# Patient Record
Sex: Male | Born: 1965 | Race: White | Hispanic: No | Marital: Married | Smoking: Never smoker
Health system: Southern US, Community
[De-identification: ages and names within clinical notes are randomized; demographics above are authoritative.]

## PROBLEM LIST (undated history)

## (undated) DIAGNOSIS — Z87898 Personal history of other specified conditions: Secondary | ICD-10-CM

## (undated) DIAGNOSIS — E119 Type 2 diabetes mellitus without complications: Secondary | ICD-10-CM

## (undated) HISTORY — DX: Personal history of other specified conditions: Z87.898

---

## 2019-09-28 ENCOUNTER — Observation Stay (HOSPITAL_COMMUNITY)
Admission: EM | Admit: 2019-09-28 | Discharge: 2019-09-29 | Disposition: A | Discharge status: Home / Self Care | Payer: Managed Care, Other (non HMO) | Attending: Internal Medicine | Admitting: Internal Medicine

## 2019-09-28 ENCOUNTER — Emergency Department (HOSPITAL_COMMUNITY): Payer: Managed Care, Other (non HMO)

## 2019-09-28 ENCOUNTER — Encounter (HOSPITAL_COMMUNITY): Payer: Self-pay

## 2019-09-28 ENCOUNTER — Other Ambulatory Visit: Payer: Self-pay

## 2019-09-28 DIAGNOSIS — E119 Type 2 diabetes mellitus without complications: Secondary | ICD-10-CM | POA: Diagnosis not present

## 2019-09-28 DIAGNOSIS — I10 Essential (primary) hypertension: Secondary | ICD-10-CM | POA: Diagnosis not present

## 2019-09-28 DIAGNOSIS — E1165 Type 2 diabetes mellitus with hyperglycemia: Principal | ICD-10-CM | POA: Insufficient documentation

## 2019-09-28 LAB — CBC WITH DIFFERENTIAL/PLATELET
Abs Immature Granulocytes: 0.03 10*3/uL (ref 0.00–0.07)
Basophils Absolute: 0.1 10*3/uL (ref 0.0–0.1)
Basophils Relative: 1 %
Eosinophils Absolute: 0 10*3/uL (ref 0.0–0.5)
Eosinophils Relative: 1 %
HCT: 45.1 % (ref 39.0–52.0)
Hemoglobin: 15.9 g/dL (ref 13.0–17.0)
Immature Granulocytes: 0 %
Lymphocytes Relative: 27 %
Lymphs Abs: 1.9 10*3/uL (ref 0.7–4.0)
MCH: 30.8 pg (ref 26.0–34.0)
MCHC: 35.3 g/dL (ref 30.0–36.0)
MCV: 87.2 fL (ref 80.0–100.0)
Monocytes Absolute: 0.7 10*3/uL (ref 0.1–1.0)
Monocytes Relative: 9 %
Neutro Abs: 4.4 10*3/uL (ref 1.7–7.7)
Neutrophils Relative %: 62 %
Platelets: 243 10*3/uL (ref 150–400)
RBC: 5.17 MIL/uL (ref 4.22–5.81)
RDW: 11.9 % (ref 11.5–15.5)
WBC: 7.1 10*3/uL (ref 4.0–10.5)
nRBC: 0 % (ref 0.0–0.2)

## 2019-09-28 LAB — URINALYSIS, ROUTINE W REFLEX MICROSCOPIC
Bacteria, UA: NONE SEEN
Bilirubin Urine: NEGATIVE
Glucose, UA: >=500 mg/dL — AB
Hgb urine dipstick: NEGATIVE
Ketones, ur: NEGATIVE mg/dL
Leukocytes,Ua: NEGATIVE
Nitrite: NEGATIVE
Protein, ur: NEGATIVE mg/dL
Specific Gravity, Urine: 1.029 (ref 1.005–1.030)
pH: 5 (ref 5.0–8.0)

## 2019-09-28 LAB — COMPREHENSIVE METABOLIC PANEL
ALT: 27 U/L (ref 0–44)
AST: 19 U/L (ref 15–41)
Albumin: 3.6 g/dL (ref 3.5–5.0)
Alkaline Phosphatase: 106 U/L (ref 38–126)
Anion gap: 12 (ref 5–15)
BUN: 16 mg/dL (ref 6–20)
CO2: 23 mmol/L (ref 22–32)
Calcium: 8.5 mg/dL — ABNORMAL LOW (ref 8.9–10.3)
Chloride: 92 mmol/L — ABNORMAL LOW (ref 98–111)
Creatinine, Ser: 0.66 mg/dL (ref 0.61–1.24)
GFR calc Af Amer: >60 mL/min (ref >60)
GFR calc non Af Amer: >60 mL/min (ref >60)
Glucose, Bld: 526 mg/dL (ref 70–99)
Potassium: 3.6 mmol/L (ref 3.5–5.1)
Sodium: 127 mmol/L — ABNORMAL LOW (ref 135–145)
Total Bilirubin: 0.6 mg/dL (ref 0.3–1.2)
Total Protein: 6.9 g/dL (ref 6.5–8.1)

## 2019-09-28 LAB — LIPASE, BLOOD: Lipase: 65 U/L — ABNORMAL HIGH (ref 11–51)

## 2019-09-28 LAB — CBG MONITORING, ED: Glucose-Capillary: >600 mg/dL (ref 70–99)

## 2019-09-28 MED ORDER — SODIUM CHLORIDE 0.9 % IV BOLUS
1000.0000 mL | Freq: Once | INTRAVENOUS | Status: AC
  Administered 2019-09-28: 1000 mL via INTRAVENOUS

## 2019-09-28 MED ORDER — SODIUM CHLORIDE 0.9 % IV SOLN: INTRAVENOUS | Status: DC

## 2019-09-28 MED ORDER — HEPARIN SODIUM (PORCINE) 5000 UNIT/ML IJ SOLN
5000.0000 [IU] | Freq: Three times a day (TID) | INTRAMUSCULAR | Status: DC
  Administered 2019-09-29: 5000 [IU] via SUBCUTANEOUS
  Filled 2019-09-28 (×2): qty 1

## 2019-09-28 MED ORDER — HYDROCODONE-ACETAMINOPHEN 5-325 MG PO TABS: 1.0000 | ORAL_TABLET | ORAL | Status: DC | PRN

## 2019-09-28 MED ORDER — HYDRALAZINE HCL 20 MG/ML IJ SOLN: 10.0000 mg | Freq: Four times a day (QID) | INTRAMUSCULAR | Status: DC | PRN

## 2019-09-28 MED ORDER — POLYETHYLENE GLYCOL 3350 17 G PO PACK: 17.0000 g | PACK | Freq: Every day | ORAL | Status: DC | PRN

## 2019-09-28 MED ORDER — INSULIN ASPART 100 UNIT/ML ~~LOC~~ SOLN: 0.0000 [IU] | SUBCUTANEOUS | Status: DC

## 2019-09-28 MED ORDER — POTASSIUM CHLORIDE 20 MEQ PO PACK
20.0000 meq | PACK | Freq: Once | ORAL | Status: AC
  Administered 2019-09-28: 20 meq via ORAL
  Filled 2019-09-28: qty 1

## 2019-09-28 MED ORDER — ONDANSETRON HCL 4 MG PO TABS: 4.0000 mg | ORAL_TABLET | Freq: Four times a day (QID) | ORAL | Status: DC | PRN

## 2019-09-28 MED ORDER — INSULIN ASPART 100 UNIT/ML ~~LOC~~ SOLN
10.0000 [IU] | Freq: Once | SUBCUTANEOUS | Status: AC
  Administered 2019-09-28: 10 [IU] via INTRAVENOUS
  Filled 2019-09-28: qty 1

## 2019-09-28 MED ORDER — ACETAMINOPHEN 650 MG RE SUPP: 650.0000 mg | Freq: Four times a day (QID) | RECTAL | Status: DC | PRN

## 2019-09-28 MED ORDER — ACETAMINOPHEN 325 MG PO TABS: 650.0000 mg | ORAL_TABLET | Freq: Four times a day (QID) | ORAL | Status: DC | PRN

## 2019-09-28 MED ORDER — ONDANSETRON HCL 4 MG/2ML IJ SOLN: 4.0000 mg | Freq: Four times a day (QID) | INTRAMUSCULAR | Status: DC | PRN

## 2019-09-28 NOTE — ED Triage Notes (Signed)
Pt reports being thirsty for months, family decided to check blood sugar and it read high. Pt blood glucose in triage is high. No other symptoms reported.

## 2019-09-28 NOTE — H&P (Signed)
TRH H&P    Patient Demographics:    Travis [name], is a 54 y.o. male  MRN: [id number]  DOB - [birth year]  Admit Date - 09/28/2019  Referring MD/NP/PA: Deretha Emory  Outpatient Primary MD for the patient is Pcp, No  Patient coming from: Home  Chief complaint- Hyperglycemia   HPI:    Travis [name]  is a 54 y.o. male, with no known medical history, who does not follow with a doctor presents to the ED with a chief complaint of hyperglycemia. Patient reports that he has been losing weight, and has had polydipsia, so a nurse relative of his checked his glucose and it was greater than 600. Patient does not have a PCP so he came into the ER. Patient reports that his weight loss has been over 10 months and has been about 35 pounds. He reports that his polydipsia started sometime last year. He has associated polyuria. He does not have polyphagia. He has no family history of diabetes that he knows of. He does report some paresthesias in his lower extremities more on the left than the right, that could be consistent with neuropathy. He has had no changes in vision. He has had no intolerance to hot or cold. No other endocrine or other complaints at this time.  In the ED Temperature 98.5, heart rate 92, respiratory rate 20, blood pressure 116/99 White blood cell count 7.1, hyponatremia 127 that corrects to 134, potassium of 3.6, glucose of 526 on the BMP. His initial CBG was greater than 600. Patient's gap is 12. Lipase is 65, does not complain of abdominal pain. UA shows glucose urea greater than 500. Chest x-ray shows no active intrathoracic process. Admission requested without first giving insulin, as a caution to treat potassium first.    Review of systems:    In addition to the HPI above,  No Fever-chills, No Headache, No changes with Vision or hearing, No problems swallowing food or Liquids, No Chest pain, Cough or  Shortness of Breath, No Abdominal pain, No Nausea or Vomiting, bowel movements are regular, No Blood in stool or Urine, No dysuria, No new skin rashes or bruises, No new joints pains-aches,  No new weakness, tingling, numbness in any extremity, No recent weight gain or loss, Positive for polyuria, polydypsia but no polyphagia, No significant Mental Stressors.  All other systems reviewed and are negative.    Past History of the following :    History reviewed. No pertinent past medical history.    History reviewed. No pertinent surgical history.    Social History:      Social History   Tobacco Use  . Smoking status: Never Smoker  . Smokeless tobacco: Never Used  Substance Use Topics  . Alcohol use: Yes    Alcohol/week: 1.0 standard drink    Types: 1 Glasses of wine per week    Comment: occ       Family History :    No family history on file. Patient reports no family history that he knows of.   Home Medications:  Prior to Admission medications   Not on File     Allergies:    No Known Allergies   Physical Exam:   Vitals  Blood pressure (!) 116/99, pulse 92, temperature 98.5 F (36.9 C), temperature source Oral, resp. rate 20, height 5\' 7"  (1.702 m), weight 59 kg, SpO2 100 %.  1.  General: Sitting up crosslegged in bed no acute distress  2. Psychiatric: Mood and behavior normal for situation Little insight to healthcare  3. Neurologic: Cranial nerves II through XII are grossly intact, no focal deficits on limited exam, at baseline  4. HEENMT:  Head is atraumatic, normocephalic, pupils are reactive to light, neck is supple, trachea is midline, mucous membranes are moist  5. Respiratory : Lungs are clear to auscultation bilaterally Barrel-shaped chest without history of smoking 6. Cardiovascular : Heart rate and rhythm are regular, no murmurs rubs or gallops  7. Gastrointestinal:  No abdominal tenderness, no distention, soft  8. Skin:    No acute lesions on limited skin exam  9.Musculoskeletal:  No peripheral edema or acute deformity    Data Review:    CBC Recent Labs  Lab 09/28/19 2055  WBC 7.1  HGB 15.9  HCT 45.1  PLT 243  MCV 87.2  MCH 30.8  MCHC 35.3  RDW 11.9  LYMPHSABS 1.9  MONOABS 0.7  EOSABS 0.0  BASOSABS 0.1   ------------------------------------------------------------------------------------------------------------------  Results for orders placed or performed during the hospital encounter of 09/28/19 (from the past 48 hour(s))  CBG monitoring, ED     Status: Abnormal   Collection Time: 09/28/19  8:18 PM  Result Value Ref Range   Glucose-Capillary >600 (HH) 70 - 99 mg/dL    Comment: Glucose reference range applies only to samples taken after fasting for at least 8 hours.  CBC with Differential/Platelet     Status: None   Collection Time: 09/28/19  8:55 PM  Result Value Ref Range   WBC 7.1 4.0 - 10.5 K/uL   RBC 5.17 4.22 - 5.81 MIL/uL   Hemoglobin 15.9 13.0 - 17.0 g/dL   HCT 09/30/19 39 - 52 %   MCV 87.2 80.0 - 100.0 fL   MCH 30.8 26.0 - 34.0 pg   MCHC 35.3 30.0 - 36.0 g/dL   RDW 46.2 70.3 - 50.0 %   Platelets 243 150 - 400 K/uL   nRBC 0.0 0.0 - 0.2 %   Neutrophils Relative % 62 %   Neutro Abs 4.4 1.7 - 7.7 K/uL   Lymphocytes Relative 27 %   Lymphs Abs 1.9 0.7 - 4.0 K/uL   Monocytes Relative 9 %   Monocytes Absolute 0.7 0 - 1 K/uL   Eosinophils Relative 1 %   Eosinophils Absolute 0.0 0 - 0 K/uL   Basophils Relative 1 %   Basophils Absolute 0.1 0 - 0 K/uL   Immature Granulocytes 0 %   Abs Immature Granulocytes 0.03 0.00 - 0.07 K/uL    Comment: Performed at [facility], [address] [place], [place]  Urinalysis, Routine w reflex microscopic Urine, Clean Catch     Status: Abnormal   Collection Time: 09/28/19  8:55 PM  Result Value Ref Range   Color, Urine COLORLESS (A) YELLOW   APPearance CLEAR CLEAR   Specific Gravity, Urine 1.029 1.005 - 1.030   pH 5.0 5.0 - 8.0    Glucose, UA >=500 (A) NEGATIVE mg/dL   Hgb urine dipstick NEGATIVE NEGATIVE   Bilirubin Urine NEGATIVE NEGATIVE   Ketones, ur  NEGATIVE NEGATIVE mg/dL   Protein, ur NEGATIVE NEGATIVE mg/dL   Nitrite NEGATIVE NEGATIVE   Leukocytes,Ua NEGATIVE NEGATIVE   WBC, UA 0-5 0 - 5 WBC/hpf   Bacteria, UA NONE SEEN NONE SEEN    Comment: Performed at [facility], [address] [place], [place] [address]  Comprehensive metabolic panel     Status: Abnormal   Collection Time: 09/28/19  9:35 PM  Result Value Ref Range   Sodium 127 (L) 135 - 145 mmol/L   Potassium 3.6 3.5 - 5.1 mmol/L   Chloride 92 (L) 98 - 111 mmol/L   CO2 23 22 - 32 mmol/L   Glucose, Bld 526 (HH) 70 - 99 mg/dL    Comment: Glucose reference range applies only to samples taken after fasting for at least 8 hours. CRITICAL RESULT CALLED TO, READ BACK BY AND VERIFIED WITH: T DEVOLT AT 2226 ON 09/28/2019 BY MOSLEY,J     BUN 16 6 - 20 mg/dL   Creatinine, Ser 6.040.66 0.61 - 1.24 mg/dL   Calcium 8.5 (L) 8.9 - 10.3 mg/dL   Total Protein 6.9 6.5 - 8.1 g/dL   Albumin 3.6 3.5 - 5.0 g/dL   AST 19 15 - 41 U/L   ALT 27 0 - 44 U/L   Alkaline Phosphatase 106 38 - 126 U/L   Total Bilirubin 0.6 0.3 - 1.2 mg/dL   GFR calc non Af Amer >60 >60 mL/min   GFR calc Af Amer >60 >60 mL/min   Anion gap 12 5 - 15    Comment: Performed at [facility], [address] [place], [place] [address]  Lipase, blood     Status: Abnormal   Collection Time: 09/28/19  9:35 PM  Result Value Ref Range   Lipase 65 (H) 11 - 51 U/L    Comment: Performed at [facility], [address] [place], [place] [address]    Chemistries  Recent Labs  Lab 09/28/19 2135  NA 127*  K 3.6  CL 92*  CO2 23  GLUCOSE 526*  BUN 16  CREATININE 0.66  CALCIUM 8.5*  AST 19  ALT 27  ALKPHOS 106  BILITOT 0.6    ------------------------------------------------------------------------------------------------------------------  ------------------------------------------------------------------------------------------------------------------ GFR: Estimated Creatinine Clearance: 86 mL/min (by C-G formula based on SCr of 0.66 mg/dL). Liver Function Tests: Recent Labs  Lab 09/28/19 2135  AST 19  ALT 27  ALKPHOS 106  BILITOT 0.6  PROT 6.9  ALBUMIN 3.6   Recent Labs  Lab 09/28/19 2135  LIPASE 65*   No results for input(s): AMMONIA in the last 168 hours. Coagulation Profile: No results for input(s): INR, PROTIME in the last 168 hours. Cardiac Enzymes: No results for input(s): CKTOTAL, CKMB, CKMBINDEX, TROPONINI in the last 168 hours. BNP (last 3 results) No results for input(s): PROBNP in the last 8760 hours. HbA1C: No results for input(s): HGBA1C in the last 72 hours. CBG: Recent Labs  Lab 09/28/19 2018  GLUCAP >600*   Lipid Profile: No results for input(s): CHOL, HDL, LDLCALC, TRIG, CHOLHDL, LDLDIRECT in the last 72 hours. Thyroid Function Tests: No results for input(s): TSH, T4TOTAL, FREET4, T3FREE, THYROIDAB in the last 72 hours. Anemia Panel: No results for input(s): VITAMINB12, FOLATE, FERRITIN, TIBC, IRON, RETICCTPCT in the last 72 hours.  --------------------------------------------------------------------------------------------------------------- Urine analysis:    Component Value Date/Time   COLORURINE COLORLESS (A) 09/28/2019 2055   APPEARANCEUR CLEAR 09/28/2019 2055   LABSPEC 1.029 09/28/2019 2055   PHURINE 5.0 09/28/2019 2055   GLUCOSEU >=500 (A) 09/28/2019 2055  HGBUR NEGATIVE 09/28/2019 2055   BILIRUBINUR NEGATIVE 09/28/2019 2055   KETONESUR NEGATIVE 09/28/2019 2055   PROTEINUR NEGATIVE 09/28/2019 2055   NITRITE NEGATIVE 09/28/2019 2055   LEUKOCYTESUR NEGATIVE 09/28/2019 2055      Imaging Results:    DG Chest Port 1 View  Result Date:  09/28/2019 CLINICAL DATA:  Hyperglycemia EXAM: PORTABLE CHEST 1 VIEW COMPARISON:  None. FINDINGS: Single frontal view of the chest demonstrates an unremarkable cardiac silhouette. Small hiatal hernia. No airspace disease, effusion, or pneumothorax. IMPRESSION: 1. No acute intrathoracic process. Electronically Signed   By: Sharlet Salina M.D.   On: 09/28/2019 21:23      Assessment & Plan:    Active Problems:   New onset type 2 diabetes mellitus (HCC)   1. New onset DMII 1. Glucose > 600 on arrival.  2. K+ 3.6 - given in ED 3. Gap 12, glucose 526, Bacrb 23 4. 10 units novolog corrected to glucose to 216 5. q4H CBG and SSI 6. Hgb A1C pending 7. Heart Healthy / Carb modified diet 8. Diabetes coordinator and dietitian consulted if available on weekend 2. Dehydration 1. Tachy At 92, with soft with SBP 116 2. 1 L NaCl in ED 3. Likely 2/2 polyuria due to hyperglycemia 4. Continue IV fluids 3. Elevated lipase 1. Lipase 65 2. No abdominal tenderness    DVT Prophylaxis-  Heparin - SCDs   AM Labs Ordered, also please review Full Orders  Family Communication: Admission, patients condition and plan of care including tests being ordered have been discussed with the patient and wife Janelle Floor who indicate understanding and agree with the plan and Code Status.  Code Status:  Full  Admission status: Observation for further management of glucose, electrolytes and DM education. Patient does not follow with PCP, and electrolytes are borderline. Discharge without stabilization of glucose and electrolytes would put this patient at risk of decompensating into an acidotic state. Also, patient is tachycardic and pressure is soft, indicating dehydration as well.      Time spent in minutes : 64   Cletus Mehlhoff B Zierle-Ghosh DO

## 2019-09-28 NOTE — ED Notes (Signed)
Entered room and introduced self to patient and explained role in plan of care. Pt appears in no acute distress, respirations are even and unlabored with equal chest rise and fall. Bed is locked in the lowest position, side rails x2, call bell within reach. All questions and concerns voiced addressed by this RN. Will continue to monitor at this time.

## 2019-09-28 NOTE — ED Notes (Signed)
Pt up and ambulatory to restroom. Urine sample provided by patient at this time.

## 2019-09-28 NOTE — ED Notes (Signed)
X Ray at bedside at this time.  

## 2019-09-28 NOTE — ED Provider Notes (Signed)
[facility] EMERGENCY DEPARTMENT Provider Note   CSN: [id number] Arrival date & time: 09/28/19  1943     History Chief Complaint  Patient presents with  . Hyperglycemia    Travis [name] is a 54 y.o. male.  Patient with negative past medical history.  Patient states that for about a month he has had increased thirst and has been increased urination.  Family members checked his blood sugar.  Has a family member that is a Engineer, civil (consulting).  And it was high.  Blood sugar here was greater than 600.  Patient without any symptoms.  Also hypertensive here with a blood pressure 177/98.  Patient without any chest pain without any shortness of breath no fevers no cough no congestion no headache.  Patient has not had any of the Covid vaccines        History reviewed. No pertinent past medical history.  Patient Active Problem List   Diagnosis Date Noted  . New onset type 2 diabetes mellitus (HCC) 09/28/2019    History reviewed. No pertinent surgical history.     No family history on file.  Social History   Tobacco Use  . Smoking status: Never Smoker  . Smokeless tobacco: Never Used  Substance Use Topics  . Alcohol use: Yes    Alcohol/week: 1.0 standard drink    Types: 1 Glasses of wine per week    Comment: occ  . Drug use: Never    Home Medications Prior to Admission medications   Not on File    Allergies    Patient has no known allergies.  Review of Systems   Review of Systems  Constitutional: Negative for chills and fever.  HENT: Negative for congestion, rhinorrhea and sore throat.   Eyes: Negative for visual disturbance.  Respiratory: Negative for cough and shortness of breath.   Cardiovascular: Negative for chest pain and leg swelling.  Gastrointestinal: Negative for abdominal pain, diarrhea, nausea and vomiting.  Genitourinary: Negative for dysuria.  Musculoskeletal: Negative for back pain and neck pain.  Skin: Negative for rash.  Neurological: Negative for  dizziness, light-headedness and headaches.  Hematological: Does not bruise/bleed easily.  Psychiatric/Behavioral: Negative for confusion.    Physical Exam Updated Vital Signs BP (!) 116/99   Pulse 92   Temp 98.5 F (36.9 C) (Oral)   Resp 20   Ht 1.702 m (5\' 7" )   Wt 59 kg   SpO2 100%   BMI 20.36 kg/m   Physical Exam Vitals and nursing note reviewed.  Constitutional:      Appearance: Normal appearance. He is well-developed.  HENT:     Head: Normocephalic and atraumatic.  Eyes:     Extraocular Movements: Extraocular movements intact.     Conjunctiva/sclera: Conjunctivae normal.     Pupils: Pupils are equal, round, and reactive to light.  Cardiovascular:     Rate and Rhythm: Normal rate and regular rhythm.     Heart sounds: No murmur heard.   Pulmonary:     Effort: Pulmonary effort is normal. No respiratory distress.     Breath sounds: Normal breath sounds.  Abdominal:     Palpations: Abdomen is soft.     Tenderness: There is no abdominal tenderness.  Musculoskeletal:        General: No swelling. Normal range of motion.     Cervical back: Normal range of motion and neck supple.  Skin:    General: Skin is warm and dry.     Capillary Refill: Capillary refill takes less than  2 seconds.  Neurological:     General: No focal deficit present.     Mental Status: He is alert and oriented to person, place, and time.     Cranial Nerves: No cranial nerve deficit.     Sensory: No sensory deficit.     Motor: No weakness.     ED Results / Procedures / Treatments   Labs (all labs ordered are listed, but only abnormal results are displayed) Labs Reviewed  COMPREHENSIVE METABOLIC PANEL - Abnormal; Notable for the following components:      Result Value   Sodium 127 (*)    Chloride 92 (*)    Glucose, Bld 526 (*)    Calcium 8.5 (*)    All other components within normal limits  LIPASE, BLOOD - Abnormal; Notable for the following components:   Lipase 65 (*)    All other  components within normal limits  URINALYSIS, ROUTINE W REFLEX MICROSCOPIC - Abnormal; Notable for the following components:   Color, Urine COLORLESS (*)    Glucose, UA >=500 (*)    All other components within normal limits  CBG MONITORING, ED - Abnormal; Notable for the following components:   Glucose-Capillary >600 (*)    All other components within normal limits  CBC WITH DIFFERENTIAL/PLATELET    EKG None  Radiology DG Chest Port 1 View  Result Date: 09/28/2019 CLINICAL DATA:  Hyperglycemia EXAM: PORTABLE CHEST 1 VIEW COMPARISON:  None. FINDINGS: Single frontal view of the chest demonstrates an unremarkable cardiac silhouette. Small hiatal hernia. No airspace disease, effusion, or pneumothorax. IMPRESSION: 1. No acute intrathoracic process. Electronically Signed   By: Sharlet Salina M.D.   On: 09/28/2019 21:23    Procedures Procedures (including critical care time)  Medications Ordered in ED Medications  0.9 %  sodium chloride infusion ( Intravenous New Bag/Given 09/28/19 2123)  insulin aspart (novoLOG) injection 10 Units (has no administration in time range)  potassium chloride (KLOR-CON) packet 20 mEq (has no administration in time range)  sodium chloride 0.9 % bolus 1,000 mL (1,000 mLs Intravenous Bolus from Bag 09/28/19 2120)    ED Course  I have reviewed the triage vital signs and the nursing notes.  Pertinent labs & imaging results that were available during my care of the patient were reviewed by me and considered in my medical decision making (see chart for details).    MDM Rules/Calculators/A&P                          Patient with new onset diabetes.  Fingerstick blood sugar here was greater than 600.  Complete metabolic panel had blood sugar of 526.  But this was drawn somewhat after fluids were given.  Patient given a liter of fluids.  Not acidotic CO2 was 23.  Sodium is low at 127 due to pseudohyponatremia.  Liver function test without any significant abnormality  Covid testing is pending.  Chest x-ray was negative.  Patient nontoxic no acute distress.  Needs admission for new onset diabetes.  Did order once we had electrolytes back his potassium was 3.6.  Did order 10 units of regular insulin IV did not feel that patient needs insulin drip at this time.  Probably had the elevated blood sugar for a period of time.  Also probably needs some blood pressure control.  Discussed with hospitalist they will see and admit.     Final Clinical Impression(s) / ED Diagnoses Final diagnoses:  Diabetes mellitus, new onset (  [facility])  Essential hypertension    Rx / DC Orders ED Discharge Orders    None       Vanetta Mulders, MD 09/28/19 2302

## 2019-09-29 DIAGNOSIS — E119 Type 2 diabetes mellitus without complications: Secondary | ICD-10-CM | POA: Diagnosis not present

## 2019-09-29 LAB — BASIC METABOLIC PANEL
Anion gap: 11 (ref 5–15)
Anion gap: 8 (ref 5–15)
Anion gap: 8 (ref 5–15)
BUN: 10 mg/dL (ref 6–20)
BUN: 13 mg/dL (ref 6–20)
BUN: 8 mg/dL (ref 6–20)
CO2: 26 mmol/L (ref 22–32)
CO2: 27 mmol/L (ref 22–32)
CO2: 29 mmol/L (ref 22–32)
Calcium: 8.3 mg/dL — ABNORMAL LOW (ref 8.9–10.3)
Calcium: 8.9 mg/dL (ref 8.9–10.3)
Calcium: 9.2 mg/dL (ref 8.9–10.3)
Chloride: 95 mmol/L — ABNORMAL LOW (ref 98–111)
Chloride: 98 mmol/L (ref 98–111)
Chloride: 99 mmol/L (ref 98–111)
Creatinine, Ser: 0.47 mg/dL — ABNORMAL LOW (ref 0.61–1.24)
Creatinine, Ser: 0.56 mg/dL — ABNORMAL LOW (ref 0.61–1.24)
Creatinine, Ser: 0.58 mg/dL — ABNORMAL LOW (ref 0.61–1.24)
GFR calc Af Amer: >60 mL/min (ref >60)
GFR calc Af Amer: >60 mL/min (ref >60)
GFR calc Af Amer: >60 mL/min (ref >60)
GFR calc non Af Amer: >60 mL/min (ref >60)
GFR calc non Af Amer: >60 mL/min (ref >60)
GFR calc non Af Amer: >60 mL/min (ref >60)
Glucose, Bld: 213 mg/dL — ABNORMAL HIGH (ref 70–99)
Glucose, Bld: 255 mg/dL — ABNORMAL HIGH (ref 70–99)
Glucose, Bld: 338 mg/dL — ABNORMAL HIGH (ref 70–99)
Potassium: 3.1 mmol/L — ABNORMAL LOW (ref 3.5–5.1)
Potassium: 3.4 mmol/L — ABNORMAL LOW (ref 3.5–5.1)
Potassium: 3.5 mmol/L (ref 3.5–5.1)
Sodium: 132 mmol/L — ABNORMAL LOW (ref 135–145)
Sodium: 134 mmol/L — ABNORMAL LOW (ref 135–145)
Sodium: 135 mmol/L (ref 135–145)

## 2019-09-29 LAB — CBC
HCT: 41 % (ref 39.0–52.0)
Hemoglobin: 14.1 g/dL (ref 13.0–17.0)
MCH: 30.5 pg (ref 26.0–34.0)
MCHC: 34.4 g/dL (ref 30.0–36.0)
MCV: 88.7 fL (ref 80.0–100.0)
Platelets: 232 10*3/uL (ref 150–400)
RBC: 4.62 MIL/uL (ref 4.22–5.81)
RDW: 12.1 % (ref 11.5–15.5)
WBC: 7.2 10*3/uL (ref 4.0–10.5)
nRBC: 0 % (ref 0.0–0.2)

## 2019-09-29 LAB — MAGNESIUM: Magnesium: 1.7 mg/dL (ref 1.7–2.4)

## 2019-09-29 LAB — LIPID PANEL
Cholesterol: 111 mg/dL (ref 0–200)
HDL: 31 mg/dL — ABNORMAL LOW (ref >40)
LDL Cholesterol: 55 mg/dL (ref 0–99)
Total CHOL/HDL Ratio: 3.6 RATIO
Triglycerides: 123 mg/dL (ref <150)
VLDL: 25 mg/dL (ref 0–40)

## 2019-09-29 LAB — TSH: TSH: 1.2 u[IU]/mL (ref 0.350–4.500)

## 2019-09-29 LAB — CBG MONITORING, ED
Glucose-Capillary: 206 mg/dL — ABNORMAL HIGH (ref 70–99)
Glucose-Capillary: 216 mg/dL — ABNORMAL HIGH (ref 70–99)
Glucose-Capillary: 233 mg/dL — ABNORMAL HIGH (ref 70–99)

## 2019-09-29 LAB — HIV ANTIBODY (ROUTINE TESTING W REFLEX): HIV Screen 4th Generation wRfx: NONREACTIVE

## 2019-09-29 MED ORDER — INSULIN STARTER KIT- PEN NEEDLES (ENGLISH)
1.0000 | Freq: Once | Status: AC
  Administered 2019-09-29: 1
  Filled 2019-09-29: qty 1

## 2019-09-29 MED ORDER — INSULIN ASPART 100 UNIT/ML ~~LOC~~ SOLN
0.0000 [IU] | Freq: Three times a day (TID) | SUBCUTANEOUS | Status: DC
  Filled 2019-09-29 (×2): qty 1

## 2019-09-29 MED ORDER — INSULIN ASPART 100 UNIT/ML ~~LOC~~ SOLN
0.0000 [IU] | Freq: Every day | SUBCUTANEOUS | Status: DC
  Administered 2019-09-29: 2 [IU] via SUBCUTANEOUS

## 2019-09-29 MED ORDER — INSULIN STARTER KIT- PEN NEEDLES (ENGLISH)
1.0000 | Freq: Once | Status: DC
  Filled 2019-09-29: qty 1

## 2019-09-29 MED ORDER — LIVING WELL WITH DIABETES BOOK
Freq: Once | Status: AC
  Filled 2019-09-29: qty 1

## 2019-09-29 MED ORDER — INSULIN GLARGINE 100 UNIT/ML ~~LOC~~ SOLN: 14.0000 [IU] | Freq: Every day | SUBCUTANEOUS | 11 refills | Status: DC

## 2019-09-29 MED ORDER — BLOOD GLUCOSE MONITOR KIT
PACK | 0 refills | Status: DC
Start: 2019-09-29 — End: 2019-09-29

## 2019-09-29 MED ORDER — LIVING WELL WITH DIABETES BOOK
Freq: Once | Status: DC
  Filled 2019-09-29: qty 1

## 2019-09-29 MED ORDER — INSULIN STARTER KIT- PEN NEEDLES (ENGLISH): 1.0000 | Freq: Once | 0 refills | Status: DC

## 2019-09-29 MED ORDER — BLOOD GLUCOSE MONITOR KIT
PACK | 0 refills | Status: DC
Start: 2019-09-29 — End: 2021-04-21

## 2019-09-29 MED ORDER — INSULIN STARTER KIT- PEN NEEDLES (ENGLISH): 1.0000 | Freq: Once | 0 refills | Status: AC

## 2019-09-29 MED ORDER — LIVING WELL WITH DIABETES BOOK: 1.0000 | Freq: Once | 0 refills | Status: AC

## 2019-09-29 MED ORDER — INSULIN GLARGINE 100 UNIT/ML ~~LOC~~ SOLN
14.0000 [IU] | Freq: Every day | SUBCUTANEOUS | Status: DC
  Administered 2019-09-29: 14 [IU] via SUBCUTANEOUS
  Filled 2019-09-29 (×2): qty 0.14

## 2019-09-29 MED ORDER — INSULIN ASPART 100 UNIT/ML ~~LOC~~ SOLN
0.0000 [IU] | SUBCUTANEOUS | Status: DC
  Administered 2019-09-29 (×2): 2 [IU] via SUBCUTANEOUS
  Filled 2019-09-29 (×2): qty 1

## 2019-09-29 NOTE — ED Notes (Signed)
Discussed how to check his blood sugar twice a day , once at breakfast and once at dinner , and to take 14 units of Lantus a day with flexpen per Dr,

## 2019-09-29 NOTE — Discharge Summary (Signed)
Physician Discharge Summary  Patient ID: Travis [name] MRN: [id number] DOB/AGE: [birth year] 54 y.o.  Admit date: 09/28/2019 Discharge date: 09/29/2019  Admission Diagnoses: New onset diabetes, dehydration, elevated lipase  Discharge Diagnoses:  Active Problems:   New onset type 2 diabetes mellitus ([place])   Discharged Condition: fair  Hospital Course: 54 year old male with no known medical history prior to his admission presented to the ED with a chief complaint of hyperglycemia.  In the ED his glucose was found to be over 600 on the initial CBG.  On his BMP his glucose was 526, his gap is within normal limits, his bicarb is within normal limits.  Patient's potassium at the time was 3.6, patient was given p.o. potassium to account for shift when insulin would be administered.  Patient was given 10 units of NovoLog which corrected his glucose to 216.  CBGs are closely monitored every 2 hours.  Sliding scale was administered every 4 hours.  Potassium was trended the next day on BMP and was 3.5.   Patient was started on Lantus 14 units daily which is consistent with his insulin needs over the past 24 hours.  Patient to be started on Metformin at discharge.  Patient educated on potential complications associated with diabetes mellitus, appropriate diet for glycemic control, and the importance of glycemic control. He has been educated on administration of insulin an monitoring her glucose. I have discussed with the patient dietary guideline. Patient discharged in stable condition with recommendation to follow-up with his PCP within 2 weeks.  Patient provided with a list of PCPs in the area.  Consults: dietician and diabetes coordinator  The patient will be set up with an outpatient appointment to receive diabetic education.  Significant Diagnostic Studies: labs: Glucose > 600, Hgb A1C unknown.  Treatments: IV hydration and glycemic control with insulin.  Discharge Exam: Blood pressure (!) 143/94,  pulse 69, temperature 98.5 F (36.9 C), temperature source Oral, resp. rate 15, height _0  (1.702 m), weight 59 kg, SpO2 97 %. Exam:  Constitutional:  . The patient is awake, alert, and oriented x 3. No acute distress. He is feeling better. Respiratory:  . No increased work of breathing. . No wheezes, rales, or rhonchi . No tactile fremitus Cardiovascular:  . Regular rate and rhythm . No murmurs, ectopy, or gallups. . No lateral PMI. No thrills. Abdomen:  . Abdomen is soft, non-tender, non-distended . No hernias, masses, or organomegaly . Normoactive bowel sounds.  Musculoskeletal:  . No cyanosis, clubbing, or edema Skin:  . No rashes, lesions, ulcers . palpation of skin: no induration or nodules Neurologic:  . CN 2-12 intact . Sensation all 4 extremities intact Psychiatric:  . Mental status o Mood, affect appropriate o Orientation to person, place, time  . judgment and insight appear intact  Disposition: Patient discharged home in stable condition with recommendation to follow-up with PCP in 7-10 days. He is to keep appointment with diabetic educator. He will be contacted by phone to set up appointment.    Allergies as of 09/29/2019   No Known Allergies     Medication List    TAKE these medications   blood glucose meter kit and supplies Kit Check glucose once in the morning before breakfast and once in the evening before dinner. Write down glucoses and take in to visit with PCP. Dispense based on patient and insurance preference. Use up to four times daily as directed. (FOR ICD-9 250.00, 250.01).   insulin glargine 100 UNIT/ML injection Commonly known  as: LANTUS Inject 0.14 mLs (14 Units total) into the skin daily. Start taking on: September 30, 2019   insulin starter kit- pen needles Misc 1 kit by Other route once for 1 dose.   living well with diabetes book Misc 1 each by Does not apply route once for 1 dose.            Durable Medical Equipment   (From admission, onward)         Start     Ordered   09/29/19 0904  DME Glucometer  Once        09/29/19 0905         38 minutes in the evaluation and discharge of this patient.  Signed: William Schake, DO 09/29/2019, 2:42 PM

## 2019-09-30 LAB — HEMOGLOBIN A1C
Hgb A1c MFr Bld: >15.5 % — ABNORMAL HIGH (ref 4.8–5.6)
Hgb A1c MFr Bld: >15.5 % — ABNORMAL HIGH (ref 4.8–5.6)
Mean Plasma Glucose: >398 mg/dL
Mean Plasma Glucose: >398 mg/dL

## 2019-09-30 NOTE — TOC Progression Note (Signed)
Transition of Care [facility]) - Progression Note    Patient Details  Name: Travis [name] MRN: [id number] Date of Birth: [birth year]  [facility] CM/SW Contact  Karn [place], [place] Phone Number: 09/30/2019, 2:36 PM  Clinical Narrative:  Pt d/c yesterday. Per MD, needs diabetic education. LCSW discussed with diabetic coordinator, Zerita Boers who will follow up.          Expected Discharge Plan and Services           Expected Discharge Date: 09/29/19                                     Social Determinants of Health (SDOH) Interventions    Readmission Risk Interventions No flowsheet data found.

## 2020-02-10 ENCOUNTER — Other Ambulatory Visit (HOSPITAL_COMMUNITY): Payer: Self-pay | Admitting: Internal Medicine

## 2020-02-10 DIAGNOSIS — Z13820 Encounter for screening for osteoporosis: Secondary | ICD-10-CM

## 2020-02-20 ENCOUNTER — Inpatient Hospital Stay (HOSPITAL_COMMUNITY): Admission: RE | Admit: 2020-02-20 | Payer: Managed Care, Other (non HMO) | Source: Ambulatory Visit

## 2020-03-09 ENCOUNTER — Other Ambulatory Visit: Payer: Self-pay

## 2020-03-09 ENCOUNTER — Ambulatory Visit (HOSPITAL_COMMUNITY)
Admission: RE | Admit: 2020-03-09 | Discharge: 2020-03-09 | Disposition: A | Discharge status: Home / Self Care | Payer: Managed Care, Other (non HMO) | Source: Ambulatory Visit | Attending: Internal Medicine | Admitting: Internal Medicine

## 2020-03-09 DIAGNOSIS — Z13820 Encounter for screening for osteoporosis: Secondary | ICD-10-CM | POA: Diagnosis present

## 2020-04-14 ENCOUNTER — Ambulatory Visit
Admission: EM | Admit: 2020-04-14 | Discharge: 2020-04-14 | Disposition: A | Discharge status: Home / Self Care | Payer: Managed Care, Other (non HMO) | Attending: Internal Medicine | Admitting: Internal Medicine

## 2020-04-14 ENCOUNTER — Encounter: Payer: Self-pay | Admitting: Emergency Medicine

## 2020-04-14 ENCOUNTER — Emergency Department (HOSPITAL_COMMUNITY)
Admission: EM | Admit: 2020-04-14 | Discharge: 2020-04-15 | Disposition: A | Discharge status: Home / Self Care | Payer: Managed Care, Other (non HMO) | Attending: Emergency Medicine | Admitting: Emergency Medicine

## 2020-04-14 ENCOUNTER — Other Ambulatory Visit: Payer: Self-pay

## 2020-04-14 ENCOUNTER — Encounter (HOSPITAL_COMMUNITY): Payer: Self-pay | Admitting: Emergency Medicine

## 2020-04-14 DIAGNOSIS — R339 Retention of urine, unspecified: Secondary | ICD-10-CM | POA: Diagnosis present

## 2020-04-14 DIAGNOSIS — K5900 Constipation, unspecified: Secondary | ICD-10-CM

## 2020-04-14 DIAGNOSIS — Z794 Long term (current) use of insulin: Secondary | ICD-10-CM | POA: Insufficient documentation

## 2020-04-14 DIAGNOSIS — K5909 Other constipation: Secondary | ICD-10-CM | POA: Diagnosis not present

## 2020-04-14 DIAGNOSIS — E119 Type 2 diabetes mellitus without complications: Secondary | ICD-10-CM | POA: Diagnosis not present

## 2020-04-14 DIAGNOSIS — K5641 Fecal impaction: Secondary | ICD-10-CM

## 2020-04-14 HISTORY — DX: Type 2 diabetes mellitus without complications: E11.9

## 2020-04-14 LAB — CBC WITH DIFFERENTIAL/PLATELET
Abs Immature Granulocytes: 0.02 10*3/uL (ref 0.00–0.07)
Basophils Absolute: 0.1 10*3/uL (ref 0.0–0.1)
Basophils Relative: 1 %
Eosinophils Absolute: 0 10*3/uL (ref 0.0–0.5)
Eosinophils Relative: 0 %
HCT: 41 % (ref 39.0–52.0)
Hemoglobin: 13.9 g/dL (ref 13.0–17.0)
Immature Granulocytes: 0 %
Lymphocytes Relative: 11 %
Lymphs Abs: 1.1 10*3/uL (ref 0.7–4.0)
MCH: 31 pg (ref 26.0–34.0)
MCHC: 33.9 g/dL (ref 30.0–36.0)
MCV: 91.5 fL (ref 80.0–100.0)
Monocytes Absolute: 0.7 10*3/uL (ref 0.1–1.0)
Monocytes Relative: 7 %
Neutro Abs: 8 10*3/uL — ABNORMAL HIGH (ref 1.7–7.7)
Neutrophils Relative %: 81 %
Platelets: 233 10*3/uL (ref 150–400)
RBC: 4.48 MIL/uL (ref 4.22–5.81)
RDW: 12.8 % (ref 11.5–15.5)
WBC: 9.8 10*3/uL (ref 4.0–10.5)
nRBC: 0 % (ref 0.0–0.2)

## 2020-04-14 LAB — POCT URINALYSIS DIP (MANUAL ENTRY)
Bilirubin, UA: NEGATIVE
Blood, UA: NEGATIVE
Glucose, UA: NEGATIVE mg/dL
Leukocytes, UA: NEGATIVE
Nitrite, UA: NEGATIVE
Protein Ur, POC: NEGATIVE mg/dL
Spec Grav, UA: 1.025 (ref 1.010–1.025)
Urobilinogen, UA: 0.2 E.U./dL
pH, UA: 5.5 (ref 5.0–8.0)

## 2020-04-14 LAB — BASIC METABOLIC PANEL
Anion gap: 11 (ref 5–15)
BUN: 24 mg/dL — ABNORMAL HIGH (ref 6–20)
CO2: 25 mmol/L (ref 22–32)
Calcium: 8.9 mg/dL (ref 8.9–10.3)
Chloride: 104 mmol/L (ref 98–111)
Creatinine, Ser: 0.96 mg/dL (ref 0.61–1.24)
GFR, Estimated: >60 mL/min (ref >60)
Glucose, Bld: 115 mg/dL — ABNORMAL HIGH (ref 70–99)
Potassium: 4.1 mmol/L (ref 3.5–5.1)
Sodium: 140 mmol/L (ref 135–145)

## 2020-04-14 MED ORDER — SORBITOL 70 % SOLN
960.0000 mL | TOPICAL_OIL | Freq: Once | ORAL | Status: AC
  Administered 2020-04-15: 960 mL via RECTAL
  Filled 2020-04-14: qty 473

## 2020-04-14 NOTE — ED Provider Notes (Signed)
[facility] EMERGENCY DEPARTMENT Provider Note   CSN: [id number] Arrival date & time: 04/14/20  2057     History Chief Complaint  Patient presents with  . Urinary Retention    Travis [name] is a 55 y.o. male.  HPI He complains of ongoing constipation with a sensation of fecal impaction for several weeks.  He just recently started using stool softener but does not know what his name is.  He has not been able to urinate much since yesterday.  He went to an urgent care, earlier today was referred here.  No history of urinary outlet obstruction.  He denies fever, chills, nausea or vomiting.  There is been no weakness or paresthesia.  There are no other known modifying factors.    Past Medical History:  Diagnosis Date  . Diabetes mellitus without complication [facility])     Patient Active Problem List   Diagnosis Date Noted  . New onset type 2 diabetes mellitus ([place]) 09/28/2019    History reviewed. No pertinent surgical history.     No family history on file.  Social History   Tobacco Use  . Smoking status: Never Smoker  . Smokeless tobacco: Never Used  Substance Use Topics  . Alcohol use: Yes    Alcohol/week: 1.0 standard drink    Types: 1 Glasses of wine per week    Comment: occ  . Drug use: Never    Home Medications Prior to Admission medications   Medication Sig Start Date End Date Taking? Authorizing Provider  blood glucose meter kit and supplies KIT Check glucose once in the morning before breakfast and once in the evening before dinner. Write down glucoses and take in to visit with PCP. Dispense based on patient and insurance preference. Use up to four times daily as directed. (FOR ICD-9 250.00, 250.01). 09/29/19   Swayze, Ava, DO  insulin glargine (LANTUS) 100 UNIT/ML injection Inject 0.14 mLs (14 Units total) into the skin daily. 09/30/19   Swayze, Ava, DO    Allergies    Patient has no known allergies.  Review of Systems   Review of Systems  All other  systems reviewed and are negative.   Physical Exam Updated Vital Signs BP (!) 142/100   Pulse (!) 108   Temp 98.2 F (36.8 C)   Resp 18   Ht _0  (1.702 m)   Wt 59 kg   SpO2 100%   BMI 20.37 kg/m   Physical Exam Vitals and nursing note reviewed.  Constitutional:      Appearance: He is well-developed.  HENT:     Head: Normocephalic and atraumatic.     Right Ear: External ear normal.     Left Ear: External ear normal.  Eyes:     Conjunctiva/sclera: Conjunctivae normal.     Pupils: Pupils are equal, round, and reactive to light.  Neck:     Trachea: Phonation normal.  Cardiovascular:     Rate and Rhythm: Normal rate.  Pulmonary:     Effort: Pulmonary effort is normal.  Abdominal:     Palpations: Abdomen is soft. There is mass (Consistent with urinary bladder at the umbilicus).     Tenderness: There is no abdominal tenderness.  Genitourinary:    Comments: Moderate fecal impaction is present.  Stool is brown in color Musculoskeletal:        General: Normal range of motion.     Cervical back: Normal range of motion and neck supple.  Skin:    General: Skin is  warm and dry.  Neurological:     Mental Status: He is alert and oriented to person, place, and time.     Cranial Nerves: No cranial nerve deficit.     Sensory: No sensory deficit.     Motor: No abnormal muscle tone.     Coordination: Coordination normal.  Psychiatric:        Mood and Affect: Mood normal.        Behavior: Behavior normal.        Thought Content: Thought content normal.        Judgment: Judgment normal.     ED Results / Procedures / Treatments   Labs (all labs ordered are listed, but only abnormal results are displayed) Labs Reviewed  BASIC METABOLIC PANEL - Abnormal; Notable for the following components:      Result Value   Glucose, Bld 115 (*)    BUN 24 (*)    All other components within normal limits  CBC WITH DIFFERENTIAL/PLATELET - Abnormal; Notable for the following components:    Neutro Abs 8.0 (*)    All other components within normal limits    EKG None  Radiology No results found.  Procedures Fecal disimpaction  Date/Time: 04/14/2020 11:16 PM Performed by: Daleen Bo, MD Authorized by: Daleen Bo, MD  Consent: Verbal consent obtained. Risks and benefits: risks, benefits and alternatives were discussed Consent given by: patient Patient understanding: patient states understanding of the procedure being performed Patient identity confirmed: verbally with patient Time out: Immediately prior to procedure a "time out" was called to verify the correct patient, procedure, equipment, support staff and site/side marked as required. Local anesthesia used: no  Anesthesia: Local anesthesia used: no  Sedation: Patient sedated: no  Patient tolerance: patient tolerated the procedure well with no immediate complications Comments: Removed a mass of stool approximately 4 x 4 x 3 cm.  .Critical Care Performed by: Daleen Bo, MD Authorized by: Daleen Bo, MD   Critical care provider statement:    Critical care time (minutes):  35   Critical care start time:  04/14/2020 9:30 PM   Critical care end time:  04/14/2020 11:59 PM   Critical care time was exclusive of:  Separately billable procedures and treating other patients   Critical care was time spent personally by me on the following activities:  Blood draw for specimens, development of treatment plan with patient or surrogate, discussions with consultants, evaluation of patient's response to treatment, examination of patient, obtaining history from patient or surrogate, ordering and performing treatments and interventions, ordering and review of laboratory studies, pulse oximetry, re-evaluation of patient's condition, review of old charts and ordering and review of radiographic studies     Medications Ordered in ED Medications  sorbitol, milk of mag, mineral oil, glycerin (SMOG) enema (has no  administration in time range)    ED Course  I have reviewed the triage vital signs and the nursing notes.  Pertinent labs & imaging results that were available during my care of the patient were reviewed by me and considered in my medical decision making (see chart for details).  Clinical Course as of 04/14/20 2359  Tue Apr 14, 2020  2318 Following Foley catheterization, bladder is decompressed and he has a large volume of urine in the urine bag. [EW]    Clinical Course User Index [EW] Daleen Bo, MD   MDM Rules/Calculators/A&P  Patient Vitals for the past 24 hrs:  BP Temp Pulse Resp SpO2 Height Weight  04/14/20 2117 (!) 142/100 98.2 F (36.8 C) (!) 108 18 100 % _0  (1.702 m) 59 kg     Medical Decision Making:  This patient is presenting for evaluation of difficulty urinating, which does require a range of treatment options, and is a complaint that involves a moderate risk of morbidity and mortality. The differential diagnoses include urinary outlet obstruction, constipation, kidney failure. I decided to review old records, and in summary middle-aged male presenting with combination of decreased urination constipation.  I did not require additional historical information from anyone.  Clinical Laboratory Tests Ordered, included CBC and Metabolic panel. Review indicates normal except mild elevation of glucose and BUN.  I ordered and Reviewed the urinary bladder scan medicine Test.  Greater than 1 L in the urinary bladder  Critical Interventions-like evaluation, bladder scan, Foley catheter, laboratory testing, disimpaction by me, observation, reevaluation  After These Interventions, the Patient was reevaluated and was found to require enema after disimpaction.  Foley catheter relieve bladder distention.  Enema ordered, to help stooling.  Patient can likely have urinary catheter removed if he has a large bowel movement after enema.  Creatinine  normal.  CRITICAL CARE-yes Performed by: Daleen Bo  Nursing Notes Reviewed/ Care Coordinated Applicable Imaging Reviewed Interpretation of Laboratory Data incorporated into ED treatment   Disposition by Dr. Wyvonnia Dusky following completion of enema therapy.    Final Clinical Impression(s) / ED Diagnoses Final diagnoses:  Urinary retention  Fecal impaction (HCC)  Constipation, unspecified constipation type    Rx / DC Orders ED Discharge Orders    None       Daleen Bo, MD 04/14/20 2359

## 2020-04-14 NOTE — ED Triage Notes (Signed)
Constipation x 5 days, has had some small bowel movements but not a good bowel movements.  Has taken one stool softener today at 6pm.  Also is having difficulty trying to pass urine.  States he urinated this morning and urinated a small amount since he has been in the office tonight.

## 2020-04-14 NOTE — ED Provider Notes (Signed)
[facility]    CSN: [id number] Arrival date & time: 04/14/20  1929      History   Chief Complaint No chief complaint on file.   HPI Ezekiel Menzer is a 55 y.o. male who presents with difficulty urinating and has been constipated x 5 days. The last time he voided normal was this morning. Then while here dribbled a little. He can only pass small balls of stool. He took a stool softner today. Feels he has stool stuck in his rectum he cant pass.  Last BM 2 days ago   Past Medical History:  Diagnosis Date  . Diabetes mellitus without complication [facility])     Patient Active Problem List   Diagnosis Date Noted  . New onset type 2 diabetes mellitus ([place]) 09/28/2019    History reviewed. No pertinent surgical history.     Home Medications    Prior to Admission medications   Medication Sig Start Date End Date Taking? Authorizing Provider  blood glucose meter kit and supplies KIT Check glucose once in the morning before breakfast and once in the evening before dinner. Write down glucoses and take in to visit with PCP. Dispense based on patient and insurance preference. Use up to four times daily as directed. (FOR ICD-9 250.00, 250.01). 09/29/19   Swayze, Ava, DO  insulin glargine (LANTUS) 100 UNIT/ML injection Inject 0.14 mLs (14 Units total) into the skin daily. 09/30/19   Swayze, Ava, DO    Family History No family history on file.  Social History Social History   Tobacco Use  . Smoking status: Never Smoker  . Smokeless tobacco: Never Used  Substance Use Topics  . Alcohol use: Yes    Alcohol/week: 1.0 standard drink    Types: 1 Glasses of wine per week    Comment: occ  . Drug use: Never     Allergies   Patient has no known allergies.   Review of Systems Review of Systems  Constitutional: Negative for chills and fever.  HENT: Negative for congestion.   Respiratory: Negative for cough.   Gastrointestinal: Positive for constipation and rectal pain.   Endocrine:       His DM is in good control  Genitourinary: Positive for difficulty urinating. Negative for dysuria, flank pain and urgency.       + hesitation and dribbling     Physical Exam Triage Vital Signs ED Triage Vitals  Enc Vitals Group     BP 04/14/20 1957 (!) 176/91     Pulse Rate 04/14/20 1957 86     Resp 04/14/20 1957 18     Temp 04/14/20 1957 98.1 F (36.7 C)     Temp Source 04/14/20 1957 Oral     SpO2 04/14/20 1957 97 %     Weight --      Height --      Head Circumference --      Peak Flow --      Pain Score 04/14/20 2005 0     Pain Loc --      Pain Edu? --      Excl. in [place]? --    No data found.  Updated Vital Signs BP (!) 176/91   Pulse 86   Temp 98.1 F (36.7 C) (Oral)   Resp 18   SpO2 97%   Visual Acuity Right Eye Distance:   Left Eye Distance:   Bilateral Distance:    Right Eye Near:   Left Eye Near:    Bilateral  Near:     Physical Exam Constitutional:      Comments: Very thin  HENT:     Right Ear: External ear normal.     Left Ear: External ear normal.  Eyes:     Conjunctiva/sclera: Conjunctivae normal.  Pulmonary:     Effort: Pulmonary effort is normal.  Abdominal:     General: There is distension.     Palpations: Abdomen is soft.     Comments: His bladder feels distended to umbilicus, but is not tender to palpation. No change even after giving up a small sample to do the UA  Musculoskeletal:        General: Normal range of motion.     Cervical back: Neck supple.  Skin:    General: Skin is warm and dry.     Findings: No rash.  Neurological:     Mental Status: He is alert and oriented to person, place, and time.     Gait: Gait normal.  Psychiatric:        Mood and Affect: Mood normal.        Behavior: Behavior normal.        Thought Content: Thought content normal.        Judgment: Judgment normal.      UC Treatments / Results  Labs (all labs ordered are listed, but only abnormal results are displayed) Labs Reviewed   POCT URINALYSIS DIP (MANUAL ENTRY) - Abnormal; Notable for the following components:      Result Value   Ketones, POC UA trace (5) (*)    All other components within normal limits    EKG   Radiology No results found.  Procedures Procedures (including critical care time)  Medications Ordered in UC Medications - No data to display  Initial Impression / Assessment and Plan / UC Course  I have reviewed the triage vital signs and the nursing notes. Pertinent labs  results that were available during my care of the patient were reviewed by me and considered in my medical decision making (see chart for details). He was sent to ER for further treatment.   Final Clinical Impressions(s) / UC Diagnoses   Final diagnoses:  Urinary retention  Other constipation     Discharge Instructions     Your urine does not have any signs of infection. You need to go to the ER to get disimpacted and possibly  cathed to empty your bladder.     ED Prescriptions    None     PDMP not reviewed this encounter.   Shelby Mattocks, Hershal Coria 04/14/20 2049

## 2020-04-14 NOTE — ED Triage Notes (Signed)
Pt c/o constipation x 5 days and inability to urinate since this AM. Pt was seen at the [facility] and was sent here

## 2020-04-14 NOTE — Discharge Instructions (Addendum)
Your urine does not have any signs of infection. You need to go to the ER to get disimpacted and possibly  cathed to empty your bladder.

## 2020-04-14 NOTE — ED Notes (Signed)
Patient is being discharged from the Urgent Care and sent to the Emergency Department via pov . Per Dreama Saa - [place], PA, patient is in need of higher level of care due to urinary retention . Patient is aware and verbalizes understanding of plan of care.  Vitals:   04/14/20 1957  BP: (!) 176/91  Pulse: 86  Resp: 18  Temp: 98.1 F (36.7 C)  SpO2: 97%

## 2020-04-15 ENCOUNTER — Emergency Department (HOSPITAL_COMMUNITY): Payer: Managed Care, Other (non HMO)

## 2020-04-15 MED ORDER — IOHEXOL 300 MG/ML  SOLN
100.0000 mL | Freq: Once | INTRAMUSCULAR | Status: AC | PRN
  Administered 2020-04-15: 100 mL via INTRAVENOUS

## 2020-04-15 MED ORDER — POLYETHYLENE GLYCOL 3350 17 G PO PACK: 17.0000 g | PACK | Freq: Every day | ORAL | 0 refills | Status: DC

## 2020-04-15 MED ORDER — AMOXICILLIN-POT CLAVULANATE 875-125 MG PO TABS: 1.0000 | ORAL_TABLET | Freq: Two times a day (BID) | ORAL | 0 refills | Status: DC

## 2020-04-15 NOTE — ED Notes (Signed)
Pt up to [facility], is having solid and liquid stool output post enema. Bed and linens changed, visitor at bedside. Awaiting xray after bowel movement.

## 2020-04-15 NOTE — ED Notes (Signed)
Pt resting quietly in bed, reports he feels better after bowel movements, foley remains in place without complication. VS updated, pt tolerating PO intake without complication per MD order

## 2020-04-15 NOTE — ED Notes (Signed)
EDP observed output and soiled chux pad, no new orders, pt discharged.

## 2020-04-15 NOTE — Discharge Instructions (Signed)
Follow-up with the urologist for removal of your catheter.  Take the MiraLAX and antibiotics as prescribed.  Return to the ED with worsening pain, fever, vomiting, unable to urinate or any other concerns.

## 2020-04-15 NOTE — ED Notes (Signed)
Pt having blood from rectum, EDP made aware, no new orders at this time.

## 2020-04-15 NOTE — ED Provider Notes (Signed)
Care assumed from Dr. Effie Shy.  Patient with fecal impaction as well as urinary retention.  He was disimpacted and a Foley catheter was placed. Patient feels improved.  He is able to have bowel movements on his own after having an enema.  Creatinine is normal.  Acute abdominal series shows distended stomach and single loop of dilated small bowel in the left upper quadrant.  Could represent ileus versus early obstructive change.  Discussed with patient.  He feels improved.  No vomiting. Concern for possible early obstruction can be further evaluated with CT scan.  He is agreeable to proceed  CT scan shows no bowel obstruction.  Does show rectal edema and likely colitis.  This likely was from his fecal impaction and constipation. Will give prophylactic course of antibiotics.  He feels improved and is tolerating PO.  followup with PCP and urology.  Return precautions discussed.   Nurse noticed some blood on patient's bed after he got up. Last bowel movement was brown. Some abrasion noted on perianal exam, suspect trauma from disimpaction. No evidence of significant GI bleed.    Travis Octave, MD 04/15/20 [id number]

## 2020-04-21 NOTE — Progress Notes (Signed)
04/21/2020  7:42 AM   Travis Travis [name] 1965/08/18 [id number]  Referring provider: Celene Squibb, MD [address] Travis Travis [name],  Travis [name] [id number] No chief complaint on file.    HPI: Travis Travis [name] is a 55 y.o. male who presents today for further evaluation of retention.  Patient was recently seen and discharged from the ED for a c/c of urinary retention. A foley catheter was placed, and the patient felt relief.  This was on 04/15/2020.  Notably, he was also diagnosed with fecal impaction as well.  Per the ER nurse notes, he had greater than a liter of urine in his bladder.  He underwent for voiding trial earlier this morning in the office at which time 300 cc was administered and he was Travis [name] to void 300 cc clear fluid.  He denies any significant urinary symptoms at baseline.  Has to stand to void but otherwise he is stream, empties completely, and is no baseline urinary complaints.  He also reports that he followed up the day after going into retention with his primary care doctor.  He had a PSA checked and per his report, 0.2.  He thinks that his urinary issues are related to his severe fecal impaction.  Is not really had a problem with this in the past when he was trying to drink more water.  Notably, history of severe diabetes.  He was diagnosed last year extremities hemoglobin A1c was greater than 15.5.  He is now gotten under control and reports that he was most recently around 6.  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  Never smoker.   IMPRESSION of CT Abdomen and Pelvis with Contrast on 04/15/2020: 1. Diffuse colonic wall thickening most prominently seen in the rectosigmoid colon with pericolonic edema. Changes likely represent colitis. Consider infectious or inflammatory causes versus pseudomembranous colitis. 2. No evidence of bowel obstruction. 3. Moderate-sized esophageal hiatal hernia. 4. Spondylolysis with mild spondylolisthesis at L5-S1.    PMH: Past Medical  History:  Diagnosis Date  . Diabetes mellitus without complication [facility])     Surgical History: No past surgical history on file.  Home Medications:  Allergies as of 04/22/2020   No Known Allergies     Medication List       Accurate as of April 21, 2020  7:42 AM. If you have any questions, ask your nurse or doctor.        amoxicillin-clavulanate 875-125 MG tablet Commonly known as: AUGMENTIN Take 1 tablet by mouth every 12 (twelve) hours.   blood glucose meter kit and supplies Kit Check glucose once in the morning before breakfast and once in the evening before dinner. Write down glucoses and take in to visit with PCP. Dispense based on patient and insurance preference. Use up to four times daily as directed. (FOR ICD-9 250.00, 250.01).   insulin glargine 100 UNIT/ML injection Commonly known as: LANTUS Inject 0.14 mLs (14 Units total) into the skin daily.   polyethylene glycol 17 g packet Commonly known as: MiraLax Take 17 g by mouth daily.       Allergies: No Known Allergies  Family History: No family history on file.  Social History:   reports that he has never smoked. He has never used smokeless tobacco. He reports current alcohol use of about 1.0 standard drink of alcohol per week. He reports that he does not use drugs.  ROS: Pertinent ROS in HPI.  Physical Exam: There were no vitals taken for this visit.  Constitutional:  Alert and oriented, No acute distress. HEENT: Travis Travis [name] AT, moist mucus membranes.  Trachea midline, no masses. Cardiovascular: No clubbing, cyanosis, or edema. Respiratory: Normal respiratory effort, no increased work of breathing. Skin: No rashes, bruises or suspicious lesions. Neurologic: Grossly intact, no focal deficits, moving all 4 extremities. Psychiatric: Normal mood and affect.  Laboratory Data:  Lab Results  Component Value Date   CREATININE 0.96 04/14/2020     Lab Results  Component Value Date   HGBA1C >15.5 (H)  09/29/2019       Component Value Date/Time   COLORURINE COLORLESS (A) 09/28/2019 2055   APPEARANCEUR CLEAR 09/28/2019 2055   LABSPEC 1.029 09/28/2019 2055   PHURINE 5.0 09/28/2019 2055   GLUCOSEU >=500 (A) 09/28/2019 2055   HGBUR NEGATIVE 09/28/2019 2055   BILIRUBINUR negative 04/14/2020 2042   KETONESUR trace (5) (A) 04/14/2020 2042   KETONESUR NEGATIVE 09/28/2019 2055   PROTEINUR negative 04/14/2020 2042   PROTEINUR NEGATIVE 09/28/2019 2055   UROBILINOGEN 0.2 04/14/2020 2042   NITRITE Negative 04/14/2020 2042   NITRITE NEGATIVE 09/28/2019 2055   LEUKOCYTESUR Negative 04/14/2020 2042   LEUKOCYTESUR NEGATIVE 09/28/2019 2055    I have reviewed the labs.  Pertinent Imaging:  CLINICAL DATA:  Acute abdominal pain. Dilated small bowel on radiograph. Ongoing constipation with sensation of fecal impaction for several weeks. Decreased urination.  EXAM: CT ABDOMEN AND PELVIS WITH CONTRAST  TECHNIQUE: Multidetector CT imaging of the abdomen and pelvis was performed using the standard protocol following bolus administration of intravenous contrast.  CONTRAST:  186m OMNIPAQUE IOHEXOL 300 MG/ML  SOLN  COMPARISON:  None.  FINDINGS: Lower chest: The lung bases are clear. Moderate-sized esophageal hiatal hernia. Distal esophagus is decompressed.  Hepatobiliary: No focal liver abnormality is seen. No gallstones, gallbladder wall thickening, or biliary dilatation.  Pancreas: Unremarkable. No pancreatic ductal dilatation or surrounding inflammatory changes.  Spleen: Normal in size without focal abnormality.  Adrenals/Urinary Tract: Adrenal glands are unremarkable. Kidneys are normal, without renal calculi, focal lesion, or hydronephrosis. Bladder is decompressed by Foley catheter.  Stomach/Bowel: Gas-filled stomach without wall thickening. Small bowel are mostly decompressed. Decompression limits examination of the bowel wall but no obvious bowel wall thickening  is appreciated. There is diffuse colonic wall thickening most prominently seen in the rectosigmoid colon with pericolonic edema. Changes likely represent colitis. Consider infectious or inflammatory causes versus pseudomembranous colitis. Appendix is not identified.  Vascular/Lymphatic: No significant vascular findings are present. No enlarged abdominal or pelvic lymph nodes.  Reproductive: Prostate is unremarkable.  Other: No free air or free fluid in the abdomen. Abdominal wall musculature appears intact.  Musculoskeletal: Spondylolysis with mild spondylolisthesis at L5-S1.  IMPRESSION: 1. Diffuse colonic wall thickening most prominently seen in the rectosigmoid colon with pericolonic edema. Changes likely represent colitis. Consider infectious or inflammatory causes versus pseudomembranous colitis. 2. No evidence of bowel obstruction. 3. Moderate-sized esophageal hiatal hernia. 4. Spondylolysis with mild spondylolisthesis at L5-S1.   Electronically Signed   By: WLucienne CapersM.D.   On: 04/15/2020 03:28   I have personally reviewed the images and agree with radiologist interpretation.    Assessment & Plan:    1. Urinary retention Status post successful voiding trial today, Foley removed  Based on his history, does sound like this is related to fecal impaction rather than underlying bladder neuropathy versus BPH  Warning symptoms reviewed, indications for more urgent/emergent reevaluation discussed.  Encouraged hydration.  We will reassess his urinary symptoms next week as well as recheck a PVR.  Follow Up: Follow-up next week with PA for IPSS/PVR   [place] [address] [employer], [place] [address] [phone]

## 2020-04-22 ENCOUNTER — Other Ambulatory Visit: Payer: Self-pay

## 2020-04-22 ENCOUNTER — Ambulatory Visit (INDEPENDENT_AMBULATORY_CARE_PROVIDER_SITE_OTHER): Payer: Managed Care, Other (non HMO) | Admitting: Urology

## 2020-04-22 VITALS — BP 125/79 | HR 106 | Ht 67.0 in | Wt 111.0 lb

## 2020-04-22 DIAGNOSIS — R339 Retention of urine, unspecified: Secondary | ICD-10-CM

## 2020-04-22 NOTE — Progress Notes (Signed)
Fill and Pull Catheter Removal  Patient is present today for a catheter removal.  Patient was cleaned and prepped in a sterile fashion 300 ml of sterile water/ saline was instilled into the bladder when the patient felt the urge to urinate. 31ml of water was then drained from the balloon.  A 16FR foley cath was removed from the bladder no complications were noted .  Patient as then given some time to void on their own.  Patient can void  300 ml on their own after some time.  Patient tolerated well.  Performed by: Gerarda Gunther, RMA  Follow up/ Additional notes: 1 week IPSS/PVR

## 2020-04-27 ENCOUNTER — Ambulatory Visit (INDEPENDENT_AMBULATORY_CARE_PROVIDER_SITE_OTHER): Payer: Managed Care, Other (non HMO) | Admitting: Physician Assistant

## 2020-04-27 ENCOUNTER — Other Ambulatory Visit: Payer: Self-pay

## 2020-04-27 ENCOUNTER — Encounter: Payer: Self-pay | Admitting: Physician Assistant

## 2020-04-27 VITALS — BP 122/78 | HR 91 | Ht 67.0 in | Wt 115.4 lb

## 2020-04-27 DIAGNOSIS — Z87898 Personal history of other specified conditions: Secondary | ICD-10-CM

## 2020-04-27 LAB — BLADDER SCAN AMB NON-IMAGING

## 2020-04-27 MED ORDER — TAMSULOSIN HCL 0.4 MG PO CAPS: 0.4000 mg | ORAL_CAPSULE | Freq: Every day | ORAL | 0 refills | Status: DC

## 2020-04-27 NOTE — Progress Notes (Signed)
04/27/2020 4:46 PM   Travis [name] 1965-10-24 [id number]  CC: Chief Complaint  Patient presents with  . Follow-up   HPI: Travis [name] is a 56 y.o. male with PMH DM2 with diabetic neuropathy and a recent history of urinary retention associated with severe fecal impaction who presents today for symptom recheck and repeat PVR after having passed a voiding trial in clinic 5 days ago with Dr. Erlene Quan.  Today he reports feeling well.  He is not on a daily bowel regimen, however he has been increasing his dietary fiber intake and reports no concerns for constipation today.  IPSS 8/mostly satisfied as below.  PVR 183 mL   IPSS    Row Name 04/27/20 1000         International Prostate Symptom Score   How often have you had the sensation of not emptying your bladder? Not at All     How often have you had to urinate less than every two hours? Less than 1 in 5 times     How often have you found you stopped and started again several times when you urinated? Less than half the time     How often have you found it difficult to postpone urination? Not at All     How often have you had a weak urinary stream? Less than half the time     How often have you had to strain to start urination? Less than 1 in 5 times     How many times did you typically get up at night to urinate? 2 Times     Total IPSS Score 8           Quality of Life due to urinary symptoms   If you were to spend the rest of your life with your urinary condition just the way it is now how would you feel about that? Mostly Satisfied            PMH: Past Medical History:  Diagnosis Date  . Diabetes mellitus without complication ([place])   . History of urinary retention     Surgical History: No past surgical history on file.  Home Medications:  Allergies as of 04/27/2020   No Known Allergies     Medication List       Accurate as of April 27, 2020  4:46 PM. If you have any questions, ask your nurse or doctor.         STOP taking these medications   amoxicillin-clavulanate 875-125 MG tablet Commonly known as: AUGMENTIN Stopped by: Debroah Loop, PA-C     TAKE these medications   alendronate 70 MG tablet Commonly known as: FOSAMAX Take 70 mg by mouth once a week.   blood glucose meter kit and supplies Kit Check glucose once in the morning before breakfast and once in the evening before dinner. Write down glucoses and take in to visit with PCP. Dispense based on patient and insurance preference. Use up to four times daily as directed. (FOR ICD-9 250.00, 250.01).   gabapentin 300 MG capsule Commonly known as: NEURONTIN Take 1 capsule by mouth at bedtime.   insulin glargine 100 UNIT/ML injection Commonly known as: LANTUS Inject 0.14 mLs (14 Units total) into the skin daily.   polyethylene glycol 17 g packet Commonly known as: MiraLax Take 17 g by mouth daily.   tamsulosin 0.4 MG Caps capsule Commonly known as: FLOMAX Take 1 capsule (0.4 mg total) by mouth daily. Started by: Debroah Loop, PA-C  Allergies:  No Known Allergies  Family History: No family history on file.  Social History:   reports that he has never smoked. He has never used smokeless tobacco. He reports current alcohol use of about 1.0 standard drink of alcohol per week. He reports that he does not use drugs.  Physical Exam: BP 122/78 (BP Location: Left Arm, Patient Position: Sitting, Cuff Size: Normal)   Pulse 91   Ht '5\' 7"'  (1.702 m)   Wt 115 lb 6.4 oz (52.3 kg)   BMI 18.07 kg/m   Constitutional:  Alert and oriented, no acute distress, nontoxic appearing HEENT: [place], AT Cardiovascular: No clubbing, cyanosis, or edema Respiratory: Normal respiratory effort, no increased work of breathing Skin: No rashes, bruises or suspicious lesions Neurologic: Grossly intact, no focal deficits, moving all 4 extremities Psychiatric: Normal mood and affect  Laboratory Data: Results for orders placed or  performed in visit on 04/27/20  Bladder Scan (Post Void Residual) in office  Result Value Ref Range   Scan Result 162m    Assessment & Plan:   1. History of urinary retention PVR slightly elevated today, however not in overt retention and patient denies abdominal pain.  I offered him a trial of Flomax and he accepted.  We will plan for repeat PVR and IPSS in 1 month.  Counseled him to continue a high-fiber diet and add bowel regimen as needed to reduce constipation. - Bladder Scan (Post Void Residual) in office - tamsulosin (FLOMAX) 0.4 MG CAPS capsule; Take 1 capsule (0.4 mg total) by mouth daily.  Dispense: 30 capsule; Refill: 0  Return in about 1 month (around 05/27/2020) for Follow up w/ IPSS &PVR .  SDebroah Loop PA-C  [facility] [address] [place] [phone]

## 2020-04-30 ENCOUNTER — Ambulatory Visit: Payer: Managed Care, Other (non HMO) | Admitting: Physician Assistant

## 2020-05-27 ENCOUNTER — Ambulatory Visit: Payer: Managed Care, Other (non HMO) | Admitting: Physician Assistant

## 2020-06-01 ENCOUNTER — Ambulatory Visit (INDEPENDENT_AMBULATORY_CARE_PROVIDER_SITE_OTHER): Payer: Managed Care, Other (non HMO) | Admitting: Physician Assistant

## 2020-06-01 ENCOUNTER — Encounter: Payer: Self-pay | Admitting: Physician Assistant

## 2020-06-01 ENCOUNTER — Other Ambulatory Visit: Payer: Self-pay

## 2020-06-01 VITALS — BP 134/78 | HR 71 | Ht 67.0 in | Wt 120.0 lb

## 2020-06-01 DIAGNOSIS — Z87898 Personal history of other specified conditions: Secondary | ICD-10-CM | POA: Diagnosis not present

## 2020-06-01 LAB — BLADDER SCAN AMB NON-IMAGING

## 2020-06-01 MED ORDER — TAMSULOSIN HCL 0.4 MG PO CAPS: 0.4000 mg | ORAL_CAPSULE | Freq: Every day | ORAL | 11 refills | Status: AC

## 2020-06-01 NOTE — Progress Notes (Signed)
06/01/2020 1:06 PM   Travis [name] 07-26-1965 [id number]  CC: Chief Complaint  Patient presents with  . Urinary Retention  . Follow-up    HPI: Travis [name] is a 55 y.o. male with PMH DM2 and diabetic neuropathy and a recent history of urinary retention associated with severe fecal impaction which subsequently resolved who presents today for symptom recheck and PVR on Flomax.  He is accompanied today by his wife, who contributes to HPI.  Today he reports doing well on Flomax.  He thinks his urine stream is stronger and that he is emptying his bladder better than before.  He states he is tolerating this medication without orthostasis.  He wishes to continue Flomax.  IPSS 7/pleased as below, previously 8/mostly satisfied.  PVR 73 mL, previously 183 mL.   IPSS    Row Name 06/01/20 1000         International Prostate Symptom Score   How often have you had the sensation of not emptying your bladder? Not at All     How often have you had to urinate less than every two hours? Not at All     How often have you found you stopped and started again several times when you urinated? Less than half the time     How often have you found it difficult to postpone urination? Not at All     How often have you had a weak urinary stream? Less than half the time     How often have you had to strain to start urination? Less than half the time     How many times did you typically get up at night to urinate? 1 Time     Total IPSS Score 7           Quality of Life due to urinary symptoms   If you were to spend the rest of your life with your urinary condition just the way it is now how would you feel about that? Pleased            PMH: Past Medical History:  Diagnosis Date  . Diabetes mellitus without complication ([place])   . History of urinary retention     Surgical History: No past surgical history on file.  Home Medications:  Allergies as of 06/01/2020   No Known Allergies      Medication List       Accurate as of Jun 01, 2020  1:06 PM. If you have any questions, ask your nurse or doctor.        alendronate 70 MG tablet Commonly known as: FOSAMAX Take 70 mg by mouth once a week.   blood glucose meter kit and supplies Kit Check glucose once in the morning before breakfast and once in the evening before dinner. Write down glucoses and take in to visit with PCP. Dispense based on patient and insurance preference. Use up to four times daily as directed. (FOR ICD-9 250.00, 250.01).   gabapentin 300 MG capsule Commonly known as: NEURONTIN Take 1 capsule by mouth at bedtime.   insulin glargine 100 UNIT/ML injection Commonly known as: LANTUS Inject 0.14 mLs (14 Units total) into the skin daily.   polyethylene glycol 17 g packet Commonly known as: MiraLax Take 17 g by mouth daily.   tamsulosin 0.4 MG Caps capsule Commonly known as: FLOMAX Take 1 capsule (0.4 mg total) by mouth daily.       Allergies:  No Known Allergies  Family History: No family  history on file.  Social History:   reports that he has never smoked. He has never used smokeless tobacco. He reports current alcohol use of about 1.0 standard drink of alcohol per week. He reports that he does not use drugs.  Physical Exam: BP 134/78   Pulse 71   Ht '5\' 7"'  (1.702 m)   Wt 120 lb (54.4 kg)   BMI 18.79 kg/m   Constitutional:  Alert and oriented, no acute distress, nontoxic appearing HEENT: [place], AT Cardiovascular: No clubbing, cyanosis, or edema Respiratory: Normal respiratory effort, no increased work of breathing Skin: No rashes, bruises or suspicious lesions Neurologic: Grossly intact, no focal deficits, moving all 4 extremities Psychiatric: Normal mood and affect  Laboratory Data: Results for orders placed or performed in visit on 06/01/20  Bladder Scan (Post Void Residual) in office  Result Value Ref Range   Scan Result 80m    Assessment & Plan:   1. History of urinary  retention Improved PVR and urinary symptoms on Flomax consistent with underlying BPH.  Patient is tolerating Flomax well and wishes to continue this med, which is reasonable.  We will refill for 1 year and plan for annual follow-ups moving forward.  Patient is in agreement with this plan. - Bladder Scan (Post Void Residual) in office - tamsulosin (FLOMAX) 0.4 MG CAPS capsule; Take 1 capsule (0.4 mg total) by mouth daily.  Dispense: 30 capsule; Refill: 11  Return in about 1 year (around 06/01/2021) for Annual DRE/IPSS/PVR with PSA.  SDebroah Loop PA-C  [facility] [address] [place] [phone]

## 2020-07-24 ENCOUNTER — Other Ambulatory Visit: Payer: Self-pay | Admitting: Internal Medicine

## 2020-07-24 ENCOUNTER — Other Ambulatory Visit (HOSPITAL_COMMUNITY): Payer: Self-pay | Admitting: Internal Medicine

## 2020-07-24 DIAGNOSIS — H532 Diplopia: Secondary | ICD-10-CM

## 2020-08-06 ENCOUNTER — Ambulatory Visit (HOSPITAL_COMMUNITY): Payer: Managed Care, Other (non HMO)

## 2020-08-14 ENCOUNTER — Other Ambulatory Visit: Payer: Self-pay

## 2020-08-14 ENCOUNTER — Ambulatory Visit (HOSPITAL_COMMUNITY)
Admission: RE | Admit: 2020-08-14 | Discharge: 2020-08-14 | Disposition: A | Discharge status: Home / Self Care | Payer: Managed Care, Other (non HMO) | Source: Ambulatory Visit | Attending: Internal Medicine | Admitting: Internal Medicine

## 2020-08-14 DIAGNOSIS — H532 Diplopia: Secondary | ICD-10-CM | POA: Diagnosis present

## 2021-02-19 ENCOUNTER — Encounter: Payer: Self-pay | Admitting: Internal Medicine

## 2021-04-21 ENCOUNTER — Encounter: Payer: Self-pay | Admitting: *Deleted

## 2021-04-21 ENCOUNTER — Ambulatory Visit (INDEPENDENT_AMBULATORY_CARE_PROVIDER_SITE_OTHER): Payer: Self-pay | Admitting: *Deleted

## 2021-04-21 VITALS — Ht 67.0 in | Wt 153.0 lb

## 2021-04-21 DIAGNOSIS — Z1211 Encounter for screening for malignant neoplasm of colon: Secondary | ICD-10-CM

## 2021-04-21 MED ORDER — PEG 3350-KCL-NA BICARB-NACL 420 G PO SOLR: 4000.0000 mL | Freq: Once | ORAL | 0 refills | Status: AC

## 2021-04-21 NOTE — Progress Notes (Signed)
Pt aware of diabetes medication adjustments.  Mailed letter to pt. ?

## 2021-04-21 NOTE — Progress Notes (Signed)
Spoke to pt.  Scheduled procedure for 06/08/2021 at 11:00, arrival 9:30 at [facility].  Reviewed prep instructions with pt by phone.  Pt made aware to pick up prep kit and OTC required items at pharmacy.  Pt aware that I am mailing out prep instructions.  Confirmed mailing address. ?

## 2021-04-21 NOTE — Progress Notes (Signed)
Called pt to schedule his procedure.  He informed me that he was busy right now and requested to call me back. ?

## 2021-04-21 NOTE — Progress Notes (Signed)
Ok to schedule. ASA 2, hold insulin morning of procedure. BMET pre-op ?

## 2021-04-21 NOTE — Addendum Note (Signed)
Addended by: Noreene Larsson on: 04/21/2021 04:27 PM ? ? Modules accepted: Orders ? ?

## 2021-04-21 NOTE — Progress Notes (Addendum)
Gastroenterology Pre-Procedure Review ? ?Request Date: 04/21/2021 ?Requesting Physician: Dr. Dwana Melena, no previous TCS ? ?PATIENT REVIEW QUESTIONS: The patient responded to the following health history questions as indicated:   ? ?1. Diabetes Melitis: yes, type II  ?2. Joint replacements in the past 12 months: no ?3. Major health problems in the past 3 months: no ?4. Has an artificial valve or MVP: no ?5. Has a defibrillator: no ?6. Has been advised in past to take antibiotics in advance of a procedure like teeth cleaning: no ?7. Family history of colon cancer: no ?8. Alcohol Use: no ?9. Illicit drug Use: no ?10. History of sleep apnea: no ?11. History of coronary artery or other vascular stents placed within the last 12 months: no ?12. History of any prior anesthesia complications: no ?13. Body mass index is 23.96 kg/m?. ?   ?MEDICATIONS & ALLERGIES:    ?Patient reports the following regarding taking any blood thinners:   ?Plavix? no ?Aspirin? no ?Coumadin? no ?Brilinta? no ?Xarelto? no ?Eliquis? no ?Pradaxa? no ?Savaysa? no ?Effient? no ? ?Patient confirms/reports the following medications:  ?Current Outpatient Medications  ?Medication Sig Dispense Refill  ? Insulin Glargine (BASAGLAR KWIKPEN) 100 UNIT/ML Inject 10 Units into the skin. Takes 10 units once daily.    ? lisinopril (ZESTRIL) 2.5 MG tablet Take 2.5 mg by mouth daily.    ? pravastatin (PRAVACHOL) 10 MG tablet Take 10 mg by mouth at bedtime.    ? tamsulosin (FLOMAX) 0.4 MG CAPS capsule Take 1 capsule (0.4 mg total) by mouth daily. 30 capsule 11  ? ?No current facility-administered medications for this visit.  ? ? ?Patient confirms/reports the following allergies:  ?No Known Allergies ? ?No orders of the defined types were placed in this encounter. ? ? ?AUTHORIZATION INFORMATION ?Primary Insurance: [employer],  [place] #: [id number],  Group #: [id number] ?Pre-Cert / Berkley Harvey required: No, not required ? ?SCHEDULE INFORMATION: ?Procedure has been scheduled as follows:   ?Date: 06/08/2021, Time: 11:00   ?Location: [facility] with Dr. Marletta Lor ? ?This Gastroenterology Pre-Precedure Review Form is being routed to the following provider(s): Brooke Bonito, NP ?  ?

## 2021-05-31 ENCOUNTER — Ambulatory Visit: Payer: Managed Care, Other (non HMO) | Admitting: Physician Assistant

## 2021-05-31 NOTE — Progress Notes (Unsigned)
06/01/2021 9:31 PM   Travis [name] 13-May-1965 [id number]  Referring provider: Benita Stabile, MD [address] [place],  [place] [address]  Urological history: 1. Urinary retention -Associated with fecal impaction, diabetes and neuropathy  2. BPH with LU TS -PSA pending -I PSS *** -PVR *** -Tamsulosin 0.4 mg daily   No chief complaint on file.   HPI: Travis [name] is a 56 y.o. male who presents today for yearly follow up.  PVR ***    Score:  1-7 Mild 8-19 Moderate 20-35 Severe     PMH: Past Medical History:  Diagnosis Date   Diabetes mellitus without complication ([facility])    History of urinary retention     Surgical History: No past surgical history on file.  Home Medications:  Allergies as of 06/01/2021   No Known Allergies      Medication List        Accurate as of May 31, 2021  9:31 PM. If you have any questions, ask your nurse or doctor.          Basaglar KwikPen 100 UNIT/ML Inject 10 Units into the skin. Takes 10 units once daily.   lisinopril 2.5 MG tablet Commonly known as: ZESTRIL Take 2.5 mg by mouth daily.   pravastatin 10 MG tablet Commonly known as: PRAVACHOL Take 10 mg by mouth at bedtime.   tamsulosin 0.4 MG Caps capsule Commonly known as: FLOMAX Take 1 capsule (0.4 mg total) by mouth daily.        Allergies: No Known Allergies  Family History: No family history on file.  Social History:  reports that he has never smoked. He has never used smokeless tobacco. He reports current alcohol use of about 1.0 standard drink per week. He reports that he does not use drugs.  ROS: Pertinent ROS in HPI  Physical Exam: There were no vitals taken for this visit.  Constitutional:  Well nourished. Alert and oriented, No acute distress. HEENT: [place] AT, moist mucus membranes.  Trachea midline, no masses. Cardiovascular: No clubbing, cyanosis, or edema. Respiratory: Normal respiratory effort, no increased work of  breathing. GI: Abdomen is soft, non tender, non distended, no abdominal masses. Liver and spleen not palpable.  No hernias appreciated.  Stool sample for occult testing is not indicated.   GU: No CVA tenderness.  No bladder fullness or masses.  Patient with circumcised/uncircumcised phallus. ***Foreskin easily retracted***  Urethral meatus is patent.  No penile discharge. No penile lesions or rashes. Scrotum without lesions, cysts, rashes and/or edema.  Testicles are located scrotally bilaterally. No masses are appreciated in the testicles. Left and right epididymis are normal. Rectal: Patient with  normal sphincter tone. Anus and perineum without scarring or rashes. No rectal masses are appreciated. Prostate is approximately *** grams, *** nodules are appreciated. Seminal vesicles are normal. Skin: No rashes, bruises or suspicious lesions. Lymph: No cervical or inguinal adenopathy. Neurologic: Grossly intact, no focal deficits, moving all 4 extremities. Psychiatric: Normal mood and affect.  Laboratory Data: N/A    Pertinent Imaging: ***   Assessment & Plan:  ***  1. Urinary retention ***  2. BPH with LUTS -PSA stable -DRE benign -UA benign -PVR < 300 cc -symptoms - *** -most bothersome symptoms are *** -continue conservative management, avoiding bladder irritants and timed voiding's -Initiate alpha-blocker (***), discussed side effects *** -Initiate 5 alpha reductase inhibitor (***), discussed side effects *** -Continue tamsulosin 0.4 mg daily, alfuzosin 10 mg daily, Rapaflo 8 mg daily, terazosin, doxazosin,  Cialis 5 mg daily and finasteride 5 mg daily, dutasteride 0.5 mg daily***:refills given -Cannot tolerate medication or medication failure, schedule cystoscopy ***     No follow-ups on file.  These notes generated with voice recognition software. I apologize for typographical errors.  Michiel Cowboy, PA-C  [facility] [address]  [address] [place], [place] [address] [phone]

## 2021-06-01 ENCOUNTER — Encounter: Payer: Self-pay | Admitting: Urology

## 2021-06-01 ENCOUNTER — Ambulatory Visit (INDEPENDENT_AMBULATORY_CARE_PROVIDER_SITE_OTHER): Payer: Managed Care, Other (non HMO) | Admitting: Urology

## 2021-06-01 VITALS — BP 159/84 | HR 62 | Ht 67.0 in | Wt 159.0 lb

## 2021-06-01 DIAGNOSIS — N138 Other obstructive and reflux uropathy: Secondary | ICD-10-CM

## 2021-06-01 DIAGNOSIS — N529 Male erectile dysfunction, unspecified: Secondary | ICD-10-CM

## 2021-06-01 DIAGNOSIS — N401 Enlarged prostate with lower urinary tract symptoms: Secondary | ICD-10-CM

## 2021-06-01 DIAGNOSIS — R339 Retention of urine, unspecified: Secondary | ICD-10-CM | POA: Diagnosis not present

## 2021-06-01 LAB — BLADDER SCAN AMB NON-IMAGING

## 2021-06-02 LAB — PSA: Prostate Specific Ag, Serum: 0.3 ng/mL (ref 0.0–4.0)

## 2021-06-03 ENCOUNTER — Telehealth: Payer: Self-pay | Admitting: *Deleted

## 2021-06-03 ENCOUNTER — Telehealth: Payer: Self-pay

## 2021-06-03 NOTE — Telephone Encounter (Signed)
Spoke to pt.  He requested to cancel procedure on 06/08/2021.  He said that he has family and work issues.  He said he will call back when ready to reschedule.  Removed from our schedule and called Eber Jones in [facility] and left her a voice mail.  FYI for Brooke Bonito, NP.

## 2021-06-03 NOTE — Telephone Encounter (Signed)
-----   Message from Harle Battiest, PA-C sent at 06/02/2021  5:03 PM EDT ----- Please let Travis [name] know that his PSA has returned well within normal limits at 0.3.  We will see him next year.

## 2021-06-03 NOTE — Telephone Encounter (Signed)
Notified pt as instructed, pt expressed understanding.

## 2021-06-08 ENCOUNTER — Encounter (HOSPITAL_COMMUNITY): Admission: RE | Payer: Self-pay | Source: Home / Self Care

## 2021-06-08 ENCOUNTER — Ambulatory Visit (HOSPITAL_COMMUNITY): Admission: RE | Admit: 2021-06-08 | Payer: Managed Care, Other (non HMO) | Source: Home / Self Care

## 2021-06-08 SURGERY — COLONOSCOPY WITH PROPOFOL
Anesthesia: Monitor Anesthesia Care

## 2021-06-22 ENCOUNTER — Other Ambulatory Visit (HOSPITAL_COMMUNITY): Payer: Self-pay | Admitting: Internal Medicine

## 2021-06-22 DIAGNOSIS — R011 Cardiac murmur, unspecified: Secondary | ICD-10-CM

## 2021-06-30 ENCOUNTER — Ambulatory Visit (HOSPITAL_COMMUNITY)
Admission: RE | Admit: 2021-06-30 | Discharge: 2021-06-30 | Disposition: A | Discharge status: Home / Self Care | Payer: Managed Care, Other (non HMO) | Source: Ambulatory Visit | Attending: Internal Medicine | Admitting: Internal Medicine

## 2021-06-30 DIAGNOSIS — R011 Cardiac murmur, unspecified: Secondary | ICD-10-CM | POA: Diagnosis not present

## 2021-06-30 LAB — ECHOCARDIOGRAM COMPLETE
Area-P 1/2: 2.42 cm2
S' Lateral: 2.4 cm

## 2021-06-30 NOTE — Progress Notes (Signed)
*  PRELIMINARY RESULTS* Echocardiogram 2D Echocardiogram has been performed.  Stacey Drain 06/30/2021, 1:28 PM

## 2022-02-09 ENCOUNTER — Encounter: Payer: Self-pay | Admitting: *Deleted

## 2022-03-09 ENCOUNTER — Other Ambulatory Visit (HOSPITAL_COMMUNITY): Payer: Self-pay | Admitting: Internal Medicine

## 2022-03-09 DIAGNOSIS — M810 Age-related osteoporosis without current pathological fracture: Secondary | ICD-10-CM

## 2022-05-30 ENCOUNTER — Other Ambulatory Visit: Payer: Managed Care, Other (non HMO)

## 2022-06-02 ENCOUNTER — Ambulatory Visit: Payer: Managed Care, Other (non HMO) | Admitting: Physician Assistant

## 2022-06-21 ENCOUNTER — Encounter: Payer: Self-pay | Admitting: *Deleted

## 2022-06-21 ENCOUNTER — Ambulatory Visit (HOSPITAL_COMMUNITY)
Admission: RE | Admit: 2022-06-21 | Discharge: 2022-06-21 | Disposition: A | Discharge status: Home / Self Care | Payer: Managed Care, Other (non HMO) | Source: Ambulatory Visit | Attending: Internal Medicine | Admitting: Internal Medicine

## 2022-06-21 DIAGNOSIS — M810 Age-related osteoporosis without current pathological fracture: Secondary | ICD-10-CM | POA: Insufficient documentation

## 2022-12-15 ENCOUNTER — Encounter: Payer: Self-pay | Admitting: *Deleted

## 2023-03-30 IMAGING — CT CT ABD-PELV W/ CM
2 of 5 series · 16 of 46 positions shown, 18 images · IV contrast (Omnipaque or Isovue)
Comparison: None.

CLINICAL DATA: Acute abdominal pain. Dilated small bowel on
radiograph. Ongoing constipation with sensation of fecal impaction
for several weeks. Decreased urination.

EXAM:
CT ABDOMEN AND PELVIS WITH CONTRAST
TECHNIQUE: Multidetector CT imaging of the abdomen and pelvis was performed
using the standard protocol following bolus administration of
intravenous contrast.
CONTRAST:  100mL OMNIPAQUE IOHEXOL 300 MG/ML  SOLN

[Series 2: axial st · axial · 0.60mm/px · z∈[+914,+1294]mm · 13 of 86 slices shown, 15 images]
[im 5/86  soft-tissue]
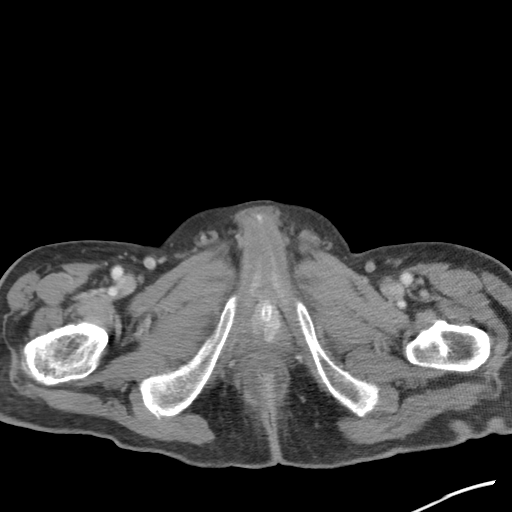
[im 5/86  bone]
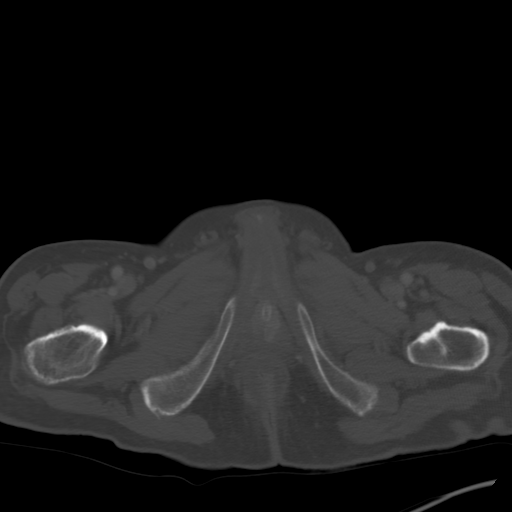
[im 14/86  soft-tissue]
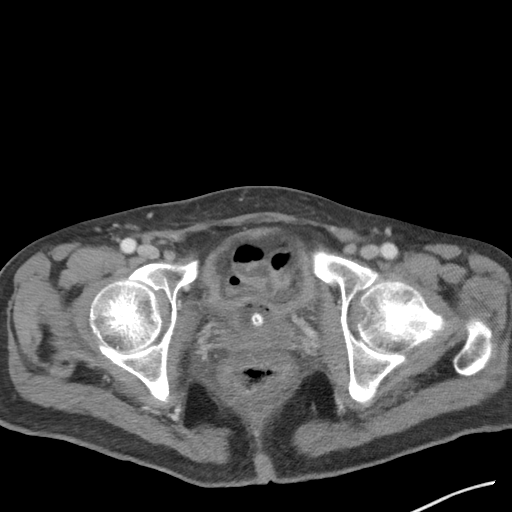
[im 18/86  soft-tissue]
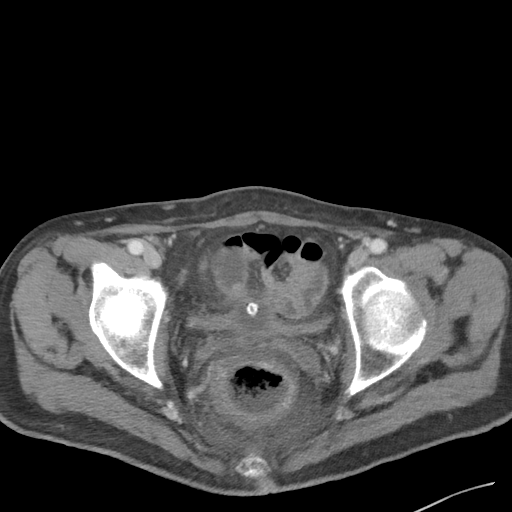
[im 23/86  soft-tissue]
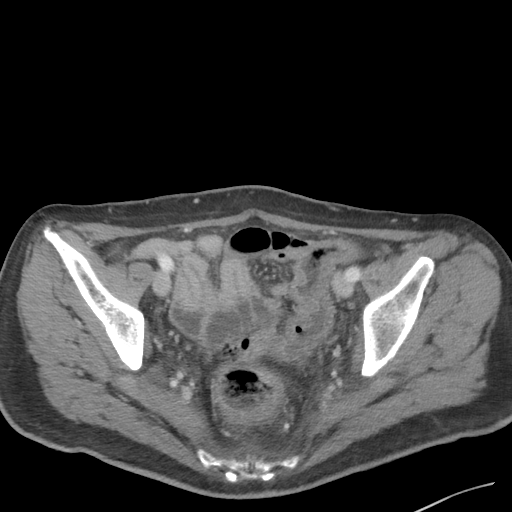
[im 32/86  soft-tissue]
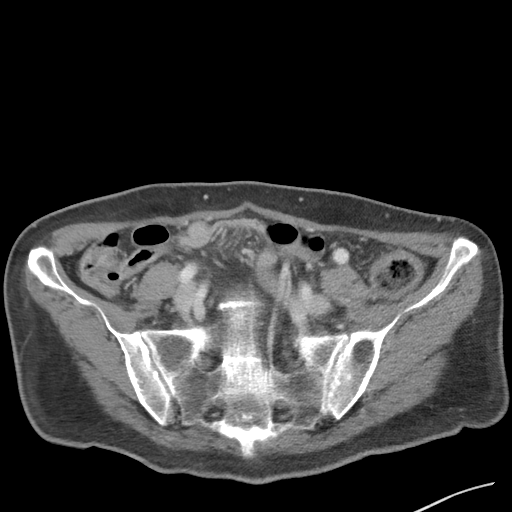
[im 36/86  soft-tissue]
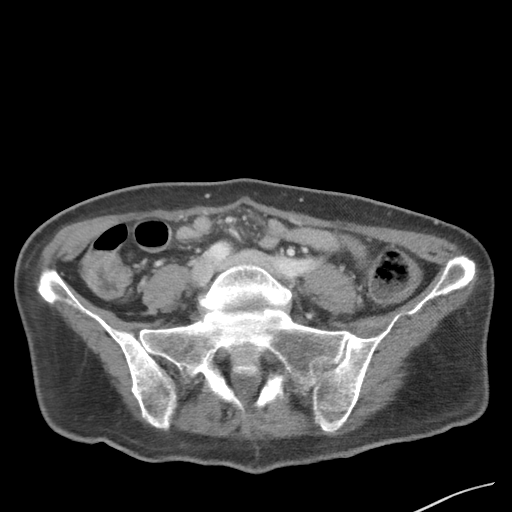
[im 45/86  soft-tissue]
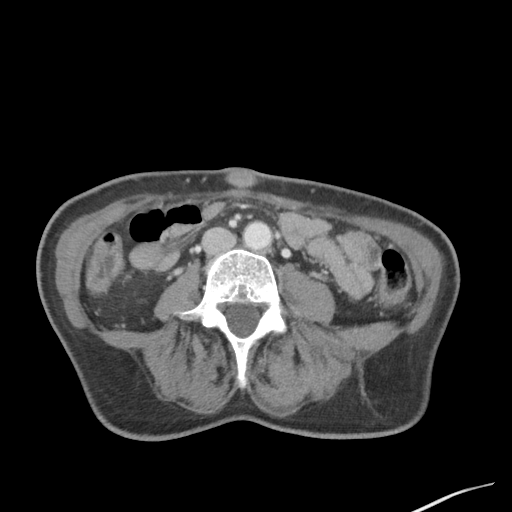
[im 50/86  soft-tissue]
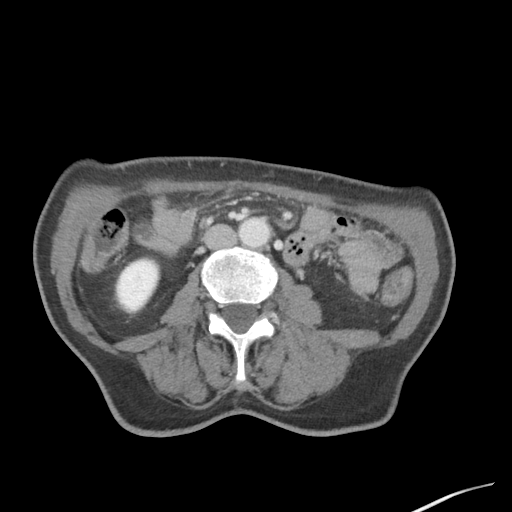
[im 54/86  soft-tissue]
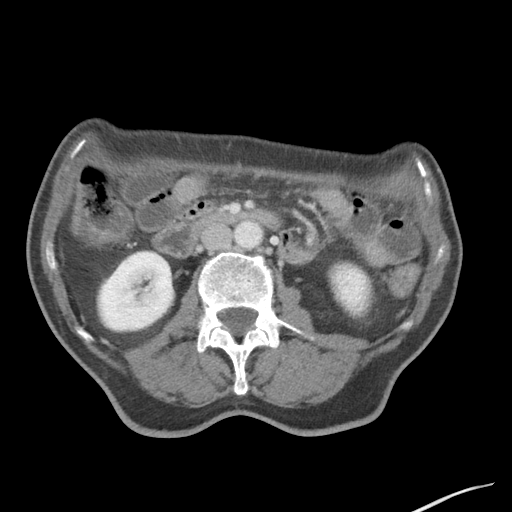
[im 54/86  bone]
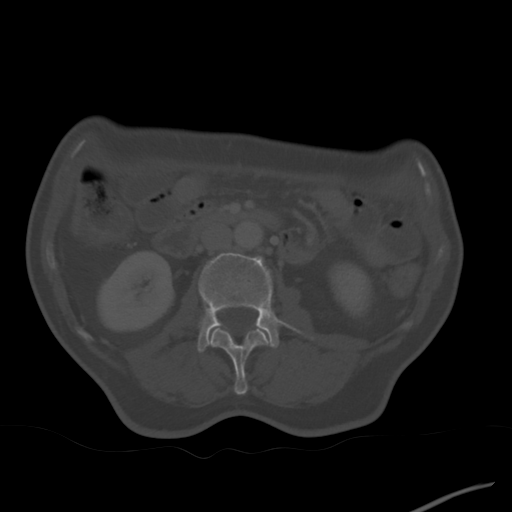
[im 63/86  soft-tissue]
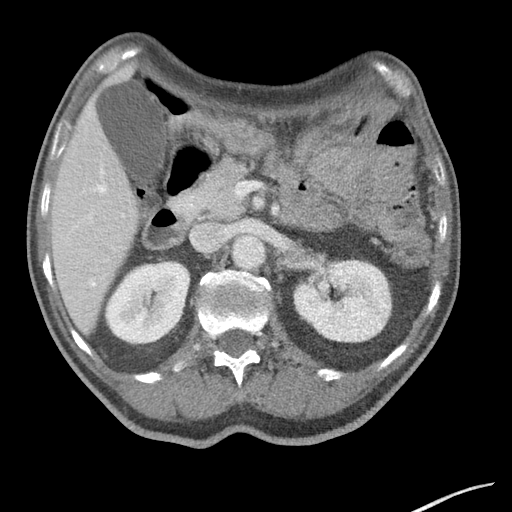
[im 68/86  soft-tissue]
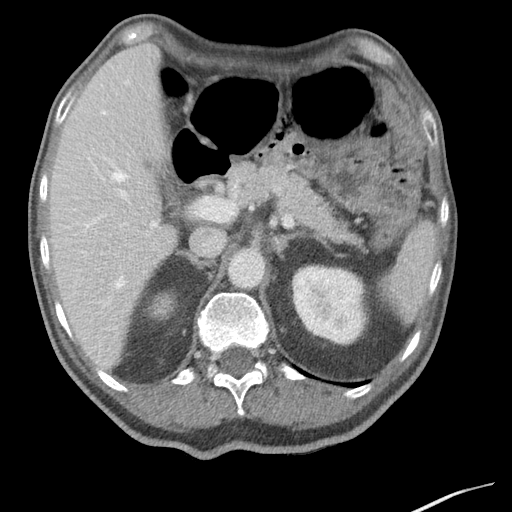
[im 72/86  soft-tissue]
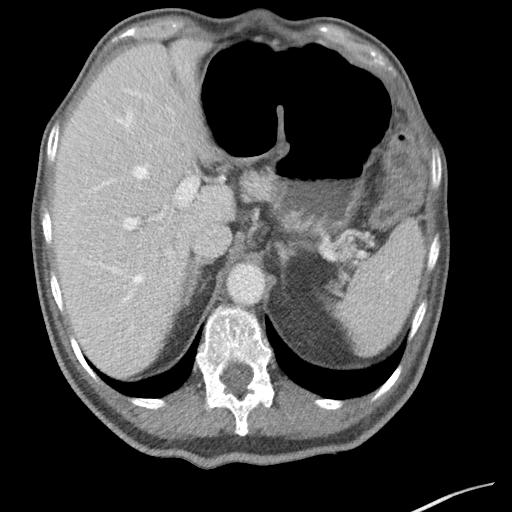
[im 81/86  soft-tissue]
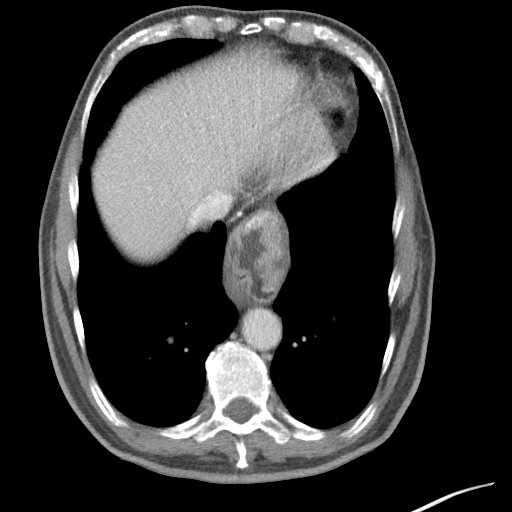

[Series 5: coronal st · coronal · 0.65mm/px · 3 of 90 slices shown]
[im 30/90  soft-tissue]
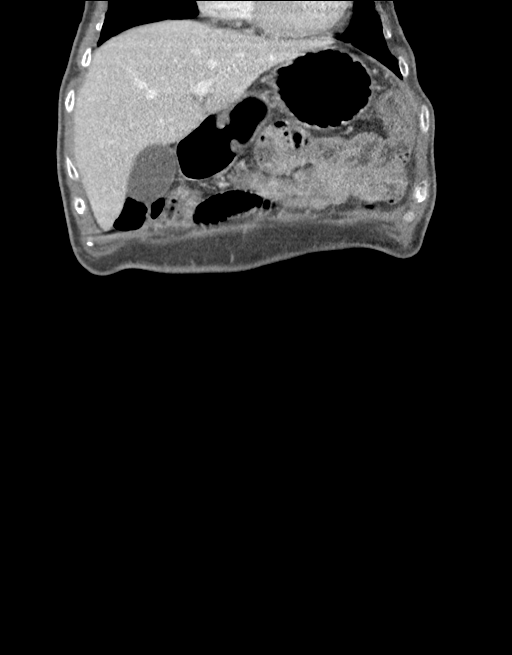
[im 40/90  soft-tissue]
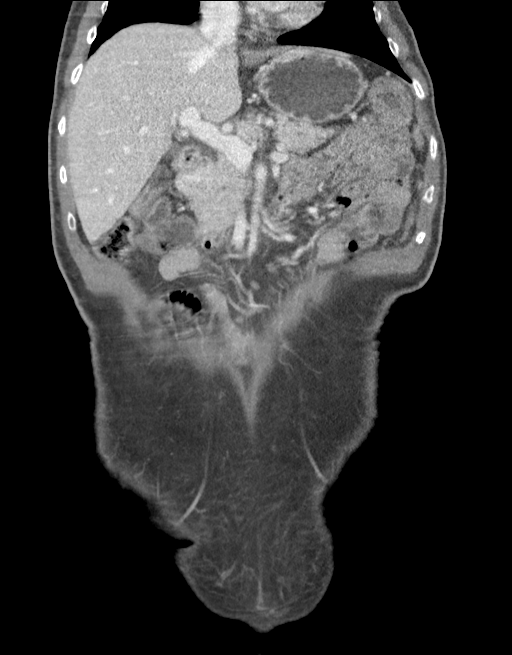
[im 50/90  soft-tissue]
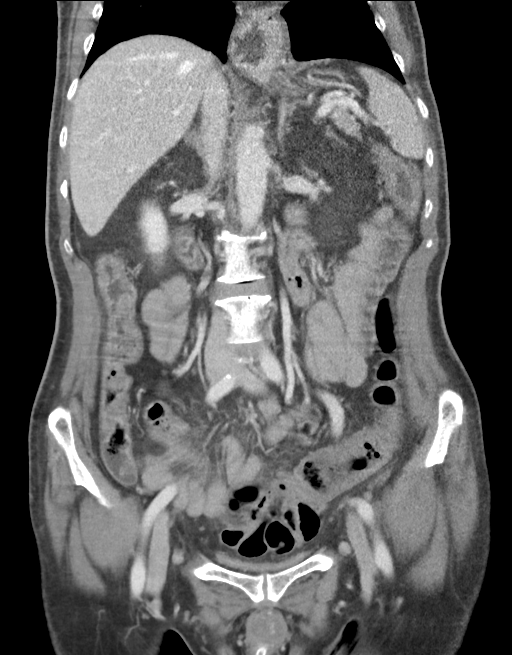

[16 of 46 positions shown; findings below may reference images not displayed]

FINDINGS: Lower chest: The lung bases are clear. Moderate-sized esophageal
hiatal hernia. Distal esophagus is decompressed.

Hepatobiliary: No focal liver abnormality is seen. No gallstones,
gallbladder wall thickening, or biliary dilatation.

Pancreas: Unremarkable. No pancreatic ductal dilatation or
surrounding inflammatory changes.

Spleen: Normal in size without focal abnormality.

Adrenals/Urinary Tract: Adrenal glands are unremarkable. Kidneys are
normal, without renal calculi, focal lesion, or hydronephrosis.
Bladder is decompressed by Foley catheter.

Stomach/Bowel: Gas-filled stomach without wall thickening. Small
bowel are mostly decompressed. Decompression limits examination of
the bowel wall but no obvious bowel wall thickening is appreciated.
There is diffuse colonic wall thickening most prominently seen in
the rectosigmoid colon with pericolonic edema. Changes likely
represent colitis. Consider infectious or inflammatory causes versus
pseudomembranous colitis. Appendix is not identified.

Vascular/Lymphatic: No significant vascular findings are present. No
enlarged abdominal or pelvic lymph nodes.

Reproductive: Prostate is unremarkable.

Other: No free air or free fluid in the abdomen. Abdominal wall
musculature appears intact.

Musculoskeletal: Spondylolysis with mild spondylolisthesis at L5-S1.
IMPRESSION: 1. Diffuse colonic wall thickening most prominently seen in the
rectosigmoid colon with pericolonic edema. Changes likely represent
colitis. Consider infectious or inflammatory causes versus
pseudomembranous colitis.
2. No evidence of bowel obstruction.
3. Moderate-sized esophageal hiatal hernia.
4. Spondylolysis with mild spondylolisthesis at L5-S1.

## 2023-08-22 ENCOUNTER — Encounter (INDEPENDENT_AMBULATORY_CARE_PROVIDER_SITE_OTHER): Payer: Self-pay | Admitting: *Deleted
